# Patient Record
Sex: Female | Born: 1950 | Race: White | Hispanic: No | Marital: Single | Smoking: Never smoker
Health system: Southern US, Community
[De-identification: ages and names within clinical notes are randomized; demographics above are authoritative.]

## PROBLEM LIST (undated history)

## (undated) DIAGNOSIS — F419 Anxiety disorder, unspecified: Secondary | ICD-10-CM

## (undated) DIAGNOSIS — J45909 Unspecified asthma, uncomplicated: Secondary | ICD-10-CM

## (undated) DIAGNOSIS — F32A Depression, unspecified: Secondary | ICD-10-CM

## (undated) DIAGNOSIS — M1990 Unspecified osteoarthritis, unspecified site: Secondary | ICD-10-CM

## (undated) DIAGNOSIS — E785 Hyperlipidemia, unspecified: Secondary | ICD-10-CM

## (undated) DIAGNOSIS — I10 Essential (primary) hypertension: Secondary | ICD-10-CM

## (undated) DIAGNOSIS — I499 Cardiac arrhythmia, unspecified: Secondary | ICD-10-CM

## (undated) HISTORY — PX: CATARACT EXTRACTION: SUR2

## (undated) HISTORY — DX: Unspecified asthma, uncomplicated: J45.909

## (undated) HISTORY — DX: Hyperlipidemia, unspecified: E78.5

## (undated) HISTORY — DX: Essential (primary) hypertension: I10

---
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
## 1974-10-17 HISTORY — PX: BREAST LUMPECTOMY: SHX2

## 2002-04-11 ENCOUNTER — Encounter: Payer: Self-pay | Admitting: Family Medicine

## 2002-04-11 ENCOUNTER — Encounter: Admission: RE | Admit: 2002-04-11 | Discharge: 2002-04-11 | Payer: Self-pay | Admitting: Family Medicine

## 2003-09-03 ENCOUNTER — Other Ambulatory Visit: Admission: RE | Admit: 2003-09-03 | Discharge: 2003-09-03 | Payer: Self-pay | Admitting: *Deleted

## 2003-10-18 HISTORY — PX: CHOLECYSTECTOMY: SHX55

## 2004-02-24 ENCOUNTER — Ambulatory Visit (HOSPITAL_COMMUNITY): Admission: RE | Admit: 2004-02-24 | Discharge: 2004-02-24 | Payer: Self-pay | Admitting: Gastroenterology

## 2004-02-27 ENCOUNTER — Ambulatory Visit (HOSPITAL_COMMUNITY): Admission: RE | Admit: 2004-02-27 | Discharge: 2004-02-27 | Payer: Self-pay | Admitting: Gastroenterology

## 2004-04-29 ENCOUNTER — Encounter (INDEPENDENT_AMBULATORY_CARE_PROVIDER_SITE_OTHER): Payer: Self-pay | Admitting: Specialist

## 2004-04-29 ENCOUNTER — Ambulatory Visit (HOSPITAL_COMMUNITY): Admission: RE | Admit: 2004-04-29 | Discharge: 2004-04-29 | Payer: Self-pay | Admitting: Surgery

## 2006-12-11 ENCOUNTER — Encounter: Admission: RE | Admit: 2006-12-11 | Discharge: 2006-12-11 | Payer: Self-pay | Admitting: Family Medicine

## 2008-03-26 ENCOUNTER — Encounter: Admission: RE | Admit: 2008-03-26 | Discharge: 2008-03-26 | Payer: Self-pay | Admitting: Family Medicine

## 2009-04-12 ENCOUNTER — Emergency Department (HOSPITAL_COMMUNITY): Admission: EM | Admit: 2009-04-12 | Discharge: 2009-04-12 | Payer: Self-pay | Admitting: Family Medicine

## 2010-03-02 ENCOUNTER — Encounter: Admission: RE | Admit: 2010-03-02 | Discharge: 2010-03-02 | Payer: Self-pay | Admitting: Family Medicine

## 2011-03-04 NOTE — Op Note (Signed)
NAMELATERA, MCLIN                           ACCOUNT NO.:  [id number]   MEDICAL RECORD NO.:  [id number]                   PATIENT TYPE:  AMB   LOCATION:  ENDO                                 FACILITY:  [facility]   PHYSICIAN:  James L. Malon Kindle., M.D.          DATE OF BIRTH:  [birth year]   DATE OF PROCEDURE:  02/24/2004  DATE OF DISCHARGE:                                 OPERATIVE REPORT   PROCEDURE:  Esophagogastroduodenoscopy.   MEDICATIONS:  Fentanyl 100 mcg, Versed 10 mg IV, Cetacaine spray.   INDICATIONS:  A 60 year old with left upper quadrant pain.  Workup has shown  multiple gallstones.  She has also had heartburn and indigestion and been on  PPIs and Tums, Rolaids in the past.  This is done to evaluate for some other  cause for left upper quadrant pain rather than gallstones.   DESCRIPTION OF PROCEDURE:  The procedure had been explained to the patient  and consent obtained.  With the patient in the left lateral decubitus  position, the Olympus scope was inserted and advanced.  The patient had a  bit of trouble swallowing the scope but eventually was able to and got this  down.  The stomach was entered, pylorus identified and passed.  The duodenum  including the bulb and second portion was seen well and was normal.  The  scope was withdrawn back into the stomach.  The pyloric channel, antrum, and  body were normal.  Fundus and cardia were seen well on the retroflexed view  and were normal.  The distal and proximal esophagus were endoscopically  normal.  The patient had a large hiatal hernia with a widely patent GE  junction but no ulcerations and no Barrett's esophagitis.  The proximal  esophagus was seen well upon removal and was normal.  The scope was  withdrawn.  The patient tolerated the procedure well.   ASSESSMENT:  1. Esophageal reflux with large hiatal hernia on endoscopy, 530.81.  2. No obvious cause for her left upper quadrant pain, 789.02.   PLAN:  Will make  arrangements for a CT of the abdomen to evaluate further  for some other cause of her left upper quadrant pain.  Will have her follow  up with Dr. Jamey Ripa.                                               James L. Malon Kindle., M.D.    Waldron Session  D:  02/24/2004  T:  02/25/2004  Job:  [id number]   cc:   Currie Paris, M.D.  [address]  [place]  [place] [address]  Fax: [phone]   Otilio Connors. Gerri Spore, M.D.  [address]  [place]  [place] [address]  Fax: [phone]

## 2011-03-04 NOTE — Op Note (Signed)
NAMEZIZA, HASTINGS NO.:  [id number]   MEDICAL RECORD NO.:  [id number]                   PATIENT TYPE:  OIB   LOCATION:  2899                                 FACILITY:  [facility]   PHYSICIAN:  Currie Paris, M.D.           DATE OF BIRTH:  05-12-1951   DATE OF PROCEDURE:  04/29/2004  DATE OF DISCHARGE:                                 OPERATIVE REPORT   [facility] #[id number]   PREOPERATIVE DIAGNOSIS:  Gallstone.   POSTOPERATIVE DIAGNOSES:  1. Gallstone.  2. Umbilical hernia.   OPERATION PERFORMED:  Laparoscopic cholecystectomy, umbilical hernia repair.   SURGEON:  Currie Paris, M.D.   ASSISTANT:  Rose Phi. Maple Hudson, M.D.   ANESTHESIA:  General endotracheal.   INDICATIONS FOR PROCEDURE:  The patient is a 60 year old with symptoms of  biliary tract disease and multiple stones seen on ultrasound.  She was  scheduled for laparoscopic cholecystectomy.   DESCRIPTION OF PROCEDURE:  The patient was seen in the holding area and had  no further questions.  She was taken to the operating room and after  satisfactory general endotracheal anesthesia had been obtained, the abdomen  was prepped and draped.  0.25% plain Marcaine was injected around the  umbilicus and the skin incision was made.  She had a small umbilical hernia  with a chronic sac which we entered and then had to clean this off of the  subcutaneous tissue of the umbilical skin.  A little bit of the omentum that  was coming out was amputated with cautery and I could enter the peritoneal  cavity here.  Once I had the fascia identified and cleaned up so that we  could get a pursestring suture in, I put a pursestring of 0 Vicryl in taking  large bites of fascia.   The Hasson was introduced.  The abdomen insufflated to 15.  Using the camera  we could see that she had a fairly large uterine fibroid as noted  preoperatively.  Photographic documentation was taken.  The patient was  placed in reversed  Trendelenburg position and a 10-11 placed in the  epigastrium and two 5 mm's laterally.  There were some omental adhesions and  the duodenum was stuck up a little bit to the gallbladder.  These were taken  down gently.  The peritoneum over the cystic duct was opened and the  triangle of Calot entered.  I could clearly see the anterior branch of the  cystic artery coming up and onto the gallbladder and the cystic duct  likewise coming up onto the gallbladder and both were dissected free.  I put  three clips on the artery and one on the duct at its junction with the  gallbladder.  The duct was opened but I was unable to thread a catheter in  for a cholangiogram because there appeared to be a little valve which we  could never overcome in the  cystic duct.  At this point I stopped attempting  the cholangiogram, went ahead and divided the cystic artery.  The cystic  duct was dissected a little bit further down so that I could get three good  clips across it which were placed and then the cystic duct was divided.  The  gallbladder was removed from below to above.  I essentially got a little bit  more mobility.  I could see the posterior branch of the cystic artery which  was clipped and divided.  The gallbladder was removed all the way from below  to above with coagulation current of the cautery.  Just prior to  disconnecting, we irrigated and made sure everything was dry.  The  gallbladder was removed from the bed, and then placed in a bag and brought  out the umbilical port.  The abdomen was reinsufflated and we made a final  check to make sure there were no bile or blood leaks and everything appeared  dry.  Lateral ports were removed and there was no bleeding.  The umbilical  site was closed with a pursestring suture and we got what appeared to be  airtight closure.  The abdomen was deflated through the epigastric port.  The skin was closed with 4-0 Monocryl subcuticular plus Dermabond.  The   patient tolerated the procedure well.  There were no operative  complications. All counts were correct.                                               Currie Paris, M.D.    CJS/MEDQ  D:  04/29/2004  T:  04/29/2004  Job:  [id number]   cc:   Otilio Connors. Gerri Spore, M.D.  [address]  [place]  [place] [address]  Fax: [phone]

## 2011-05-23 ENCOUNTER — Encounter: Payer: Self-pay | Admitting: Gastroenterology

## 2012-05-22 ENCOUNTER — Encounter: Payer: Self-pay | Admitting: Gastroenterology

## 2013-02-11 ENCOUNTER — Other Ambulatory Visit: Payer: Self-pay

## 2013-02-11 DIAGNOSIS — Z1231 Encounter for screening mammogram for malignant neoplasm of breast: Secondary | ICD-10-CM

## 2013-02-19 ENCOUNTER — Ambulatory Visit: Payer: Self-pay

## 2013-03-12 ENCOUNTER — Ambulatory Visit: Payer: Self-pay

## 2013-04-09 ENCOUNTER — Ambulatory Visit: Payer: Self-pay

## 2015-02-17 ENCOUNTER — Other Ambulatory Visit: Payer: Self-pay | Admitting: Family Medicine

## 2015-02-17 DIAGNOSIS — E049 Nontoxic goiter, unspecified: Secondary | ICD-10-CM

## 2015-02-20 ENCOUNTER — Ambulatory Visit
Admission: RE | Admit: 2015-02-20 | Discharge: 2015-02-20 | Disposition: A | Discharge status: Home / Self Care | Payer: BLUE CROSS/BLUE SHIELD | Source: Ambulatory Visit | Attending: Family Medicine | Admitting: Family Medicine

## 2015-02-20 DIAGNOSIS — E049 Nontoxic goiter, unspecified: Secondary | ICD-10-CM

## 2018-05-15 DIAGNOSIS — E785 Hyperlipidemia, unspecified: Secondary | ICD-10-CM | POA: Diagnosis not present

## 2018-05-15 DIAGNOSIS — Z6838 Body mass index (BMI) 38.0-38.9, adult: Secondary | ICD-10-CM | POA: Diagnosis not present

## 2018-05-15 DIAGNOSIS — J45909 Unspecified asthma, uncomplicated: Secondary | ICD-10-CM | POA: Diagnosis not present

## 2018-05-15 DIAGNOSIS — R69 Illness, unspecified: Secondary | ICD-10-CM | POA: Diagnosis not present

## 2018-05-15 DIAGNOSIS — I10 Essential (primary) hypertension: Secondary | ICD-10-CM | POA: Diagnosis not present

## 2018-05-15 DIAGNOSIS — R739 Hyperglycemia, unspecified: Secondary | ICD-10-CM | POA: Diagnosis not present

## 2018-05-22 DIAGNOSIS — M1712 Unilateral primary osteoarthritis, left knee: Secondary | ICD-10-CM | POA: Diagnosis not present

## 2018-05-22 DIAGNOSIS — M25562 Pain in left knee: Secondary | ICD-10-CM | POA: Diagnosis not present

## 2018-05-22 DIAGNOSIS — M545 Low back pain: Secondary | ICD-10-CM | POA: Diagnosis not present

## 2018-05-22 DIAGNOSIS — M5442 Lumbago with sciatica, left side: Secondary | ICD-10-CM | POA: Diagnosis not present

## 2018-06-19 DIAGNOSIS — M1712 Unilateral primary osteoarthritis, left knee: Secondary | ICD-10-CM | POA: Diagnosis not present

## 2018-11-07 DIAGNOSIS — H40023 Open angle with borderline findings, high risk, bilateral: Secondary | ICD-10-CM | POA: Insufficient documentation

## 2018-11-07 DIAGNOSIS — H25811 Combined forms of age-related cataract, right eye: Secondary | ICD-10-CM | POA: Diagnosis not present

## 2018-11-07 DIAGNOSIS — H25812 Combined forms of age-related cataract, left eye: Secondary | ICD-10-CM | POA: Insufficient documentation

## 2018-11-07 DIAGNOSIS — D487 Neoplasm of uncertain behavior of other specified sites: Secondary | ICD-10-CM | POA: Diagnosis not present

## 2018-11-07 DIAGNOSIS — H47323 Drusen of optic disc, bilateral: Secondary | ICD-10-CM | POA: Diagnosis not present

## 2018-12-11 DIAGNOSIS — R69 Illness, unspecified: Secondary | ICD-10-CM | POA: Diagnosis not present

## 2019-02-14 DIAGNOSIS — M545 Low back pain: Secondary | ICD-10-CM | POA: Diagnosis not present

## 2019-02-14 DIAGNOSIS — M1712 Unilateral primary osteoarthritis, left knee: Secondary | ICD-10-CM | POA: Diagnosis not present

## 2019-02-19 DIAGNOSIS — M5416 Radiculopathy, lumbar region: Secondary | ICD-10-CM | POA: Diagnosis not present

## 2019-02-19 DIAGNOSIS — M1712 Unilateral primary osteoarthritis, left knee: Secondary | ICD-10-CM | POA: Diagnosis not present

## 2019-02-19 DIAGNOSIS — S335XXD Sprain of ligaments of lumbar spine, subsequent encounter: Secondary | ICD-10-CM | POA: Diagnosis not present

## 2019-04-08 DIAGNOSIS — B301 Conjunctivitis due to adenovirus: Secondary | ICD-10-CM | POA: Diagnosis not present

## 2019-05-07 DIAGNOSIS — M1712 Unilateral primary osteoarthritis, left knee: Secondary | ICD-10-CM | POA: Diagnosis not present

## 2019-05-14 DIAGNOSIS — R69 Illness, unspecified: Secondary | ICD-10-CM | POA: Diagnosis not present

## 2019-05-28 DIAGNOSIS — R69 Illness, unspecified: Secondary | ICD-10-CM | POA: Diagnosis not present

## 2019-06-03 DIAGNOSIS — R69 Illness, unspecified: Secondary | ICD-10-CM | POA: Diagnosis not present

## 2019-06-18 DIAGNOSIS — M1712 Unilateral primary osteoarthritis, left knee: Secondary | ICD-10-CM | POA: Diagnosis not present

## 2019-06-25 DIAGNOSIS — M1712 Unilateral primary osteoarthritis, left knee: Secondary | ICD-10-CM | POA: Diagnosis not present

## 2019-07-02 DIAGNOSIS — M1712 Unilateral primary osteoarthritis, left knee: Secondary | ICD-10-CM | POA: Diagnosis not present

## 2019-07-26 DIAGNOSIS — H1045 Other chronic allergic conjunctivitis: Secondary | ICD-10-CM | POA: Diagnosis not present

## 2019-07-30 DIAGNOSIS — M1712 Unilateral primary osteoarthritis, left knee: Secondary | ICD-10-CM | POA: Diagnosis not present

## 2019-07-30 DIAGNOSIS — M25562 Pain in left knee: Secondary | ICD-10-CM | POA: Diagnosis not present

## 2019-08-13 DIAGNOSIS — H1045 Other chronic allergic conjunctivitis: Secondary | ICD-10-CM | POA: Diagnosis not present

## 2019-10-03 DIAGNOSIS — M1712 Unilateral primary osteoarthritis, left knee: Secondary | ICD-10-CM | POA: Diagnosis not present

## 2019-10-07 DIAGNOSIS — Z1159 Encounter for screening for other viral diseases: Secondary | ICD-10-CM | POA: Diagnosis not present

## 2019-10-18 HISTORY — PX: CATARACT EXTRACTION W/ INTRAOCULAR LENS IMPLANT: SHX1309

## 2019-10-23 DIAGNOSIS — Z5181 Encounter for therapeutic drug level monitoring: Secondary | ICD-10-CM | POA: Diagnosis not present

## 2019-10-23 DIAGNOSIS — R739 Hyperglycemia, unspecified: Secondary | ICD-10-CM | POA: Diagnosis not present

## 2019-11-05 DIAGNOSIS — M1712 Unilateral primary osteoarthritis, left knee: Secondary | ICD-10-CM | POA: Diagnosis not present

## 2019-11-15 ENCOUNTER — Ambulatory Visit: Payer: Medicare HMO | Admitting: Cardiology

## 2019-11-15 ENCOUNTER — Encounter: Payer: Self-pay | Admitting: Cardiology

## 2019-11-15 ENCOUNTER — Other Ambulatory Visit: Payer: Self-pay

## 2019-11-15 VITALS — BP 153/93 | HR 107 | Temp 97.3°F | Ht 64.0 in | Wt 201.0 lb

## 2019-11-15 DIAGNOSIS — I10 Essential (primary) hypertension: Secondary | ICD-10-CM

## 2019-11-15 DIAGNOSIS — R000 Tachycardia, unspecified: Secondary | ICD-10-CM

## 2019-11-15 DIAGNOSIS — Z01810 Encounter for preprocedural cardiovascular examination: Secondary | ICD-10-CM

## 2019-11-15 MED ORDER — PROPRANOLOL HCL ER 80 MG PO CP24: 80.0000 mg | ORAL_CAPSULE | Freq: Every day | ORAL | 1 refills | Status: DC

## 2019-11-15 NOTE — Progress Notes (Signed)
Primary Physician/Referring:  Maurice Small, MD  Patient ID: Emily Cruz, female    DOB: December 29, 1950, 69 y.o.   MRN: [id number]  Chief Complaint  Patient presents with  . Tachycardia  . New Patient (Initial Visit)   HPI:    Emily Cruz  is a 69 y.o. with hypertension, history of hyperlipidemia, hyperglycemia, referred to [place] in urgent basis for evaluation of tachycardia.  She recently presented for left knee surgery, and EKG found elevated heart rate 130 bpm.  Surgery was therefore canceled and she would recommend that she follow-up with cardiology.  She now presents for further evaluation.  She has noticed elevated heart rate from 80-110 bpm for the last several months. She has been inactive due to her left knee. Notices elevated heart rate with minimal activity. Denies any palpitations. No chest pain or dyspnea on exertion.   She reports being on Verapamil since 1990's after having elevated heart rate after drinking coffee. She tried Toprolol, but did not tolerate due to asthma.  She has lost 30 lbs over the last year and half, and since her hyperlipidemia and hyperglycemia have improved. No family history of heart disease.   Past Medical History:  Diagnosis Date  . Asthma   . Hypertension    Past Surgical History:  Procedure Laterality Date  . BREAST LUMPECTOMY  1976  . CHOLECYSTECTOMY  2005  . TONSILLECTOMY AND ADENOIDECTOMY  1958   Social History   Tobacco Use  . Smoking status: Never Smoker  . Smokeless tobacco: Never Used  Substance Use Topics  . Alcohol use: Never    ROS  Review of Systems  Constitution: Negative for decreased appetite, malaise/fatigue, weight gain and weight loss.  Eyes: Negative for visual disturbance.  Cardiovascular: Negative for chest pain, claudication, dyspnea on exertion, leg swelling, orthopnea, palpitations and syncope.  Respiratory: Negative for hemoptysis and wheezing.   Endocrine: Negative for cold intolerance and heat intolerance.   Hematologic/Lymphatic: Does not bruise/bleed easily.  Skin: Negative for nail changes.  Musculoskeletal: Negative for muscle weakness and myalgias.  Gastrointestinal: Negative for abdominal pain, change in bowel habit, nausea and vomiting.  Neurological: Negative for difficulty with concentration, dizziness, focal weakness and headaches.  Psychiatric/Behavioral: Negative for altered mental status and suicidal ideas.  All other systems reviewed and are negative.  Objective  Blood pressure (!) 153/93, pulse (!) 107, temperature (!) 97.3 F (36.3 C), height 5' 4"  (1.626 m), weight 201 lb (91.2 kg), SpO2 98 %.  Vitals with BMI 11/15/2019  Height 5' 4"   Weight 201 lbs  BMI 35.45  Systolic 625  Diastolic 93  Pulse 638    Physical Exam  Constitutional: She is oriented to person, place, and time. Vital signs are normal. She appears well-developed and well-nourished.  Tremor noted  HENT:  Head: Normocephalic and atraumatic.  Cardiovascular: Regular rhythm, normal heart sounds and intact distal pulses. Tachycardia present.  Pulmonary/Chest: Effort normal and breath sounds normal. No accessory muscle usage. No respiratory distress.  Abdominal: Soft. Bowel sounds are normal.  Musculoskeletal:        General: Normal range of motion.     Cervical back: Normal range of motion.  Neurological: She is alert and oriented to person, place, and time.  Skin: Skin is warm and dry.  Vitals reviewed.  Laboratory examination:   No results for input(s): NA, K, CL, CO2, GLUCOSE, BUN, CREATININE, CALCIUM, GFRNONAA, GFRAA in the last 8760 hours. CrCl cannot be calculated (No successful lab value found.).  No flowsheet  data found. No flowsheet data found. Lipid Panel  No results found for: CHOL, TRIG, HDL, CHOLHDL, VLDL, LDLCALC, LDLDIRECT HEMOGLOBIN A1C No results found for: HGBA1C, MPG TSH Recent Labs    11/15/19 1401  TSH 2.960    External labs 10/23/2019: Hemoglobin A1c 5.2%.  CBC normal.   Creatinine 0.6, EGFR 108/141, potassium 3.9, CMP normal.    Medications and allergies  No Known Allergies   Current Outpatient Medications  Medication Instructions  . albuterol (VENTOLIN HFA) 108 (90 Base) MCG/ACT inhaler 2 puffs, Inhalation, Every 6 hours PRN  . ALPRAZolam (XANAX) 0.125 mg, Oral, 2 times daily PRN  . buPROPion (WELLBUTRIN) 100 mg, Oral, 2 times daily  . pantoprazole (PROTONIX) 40 MG tablet 1 tablet, Oral, Daily  . propranolol ER (INDERAL LA) 80 mg, Oral, Daily  . verapamil (CALAN-SR) 240 mg, Oral,  Every morning - 10a    Radiology:  No results found.  Cardiac Studies:   none  Assessment     ICD-10-CM   1. Sinus tachycardia  R00.0 EKG 12-Lead    TSH  2. Preoperative cardiovascular examination  Z01.810   3. Primary hypertension  I10     EKG 11/15/2019: Sinus tachycardia at 102 bpm, normal axis, no evidence of ischemia.   Meds ordered this encounter  Medications  . propranolol ER (INDERAL LA) 80 MG 24 hr capsule    Sig: Take 1 capsule (80 mg total) by mouth daily.    Dispense:  30 capsule    Refill:  1    Order Specific Question:   Supervising Provider    Answer:   Adrian Prows [[id number]]    There are no discontinued medications.   Recommendations:   Patient urgently referred to [place] by Dr. Justin Mend for evaluation of tachycardia.  Over the last few months patient has noticed elevated heart rate with even minimal activities or just sitting resting.  EKG from surgical center and today were reviewed, revealing sinus tachycardia.  She has not had any symptoms of angina; however, her functional capacity is limited due to left knee pain.  She has had 30 pound weight loss over the last 1 year and has tremor also noted on exam today.  Question if hyperthyroidism may be contributing to her elevated heart rate.  I will obtain TSH level today.  Depending upon these results, will decide if she needs further cardiac work-up.  Her knee surgery has been postponed now to March 3, so  we will have time to perform cardiac testing if needed.  She is requesting a letter to go back to work, do not see any contraindication from cardiac standpoint.  No clinical evidence of heart failure.  Her blood pressure is elevated.  I will start her on propanolol 80 mg daily given her potential underlying thyroid issues and elevated heart rate.  Continue with verapamil.  She does have a history of asthma but has not had any exacerbations over the last several years and will need to monitor this.  I have encouraged her to continue to work on her weight loss.  I will see her back in 2 weeks for follow-up.   *I have discussed this case with Dr. Einar Gip and he personally examined the patient and participated in formulating the plan.*  *Addendum: TSH is within normal limits. Will proceed with echocardiogram and nuclear stress testing for follow up.    Miquel Dunn, MSN, APRN, FNP-C [facility]. [place] Office: [phone] Fax: [phone]

## 2019-11-16 LAB — TSH: TSH: 2.96 u[IU]/mL (ref 0.450–4.500)

## 2019-11-19 NOTE — Progress Notes (Signed)
Done will schedule

## 2019-11-19 NOTE — Progress Notes (Signed)
Orders placed. Please contact patient to schedule stress and echo

## 2019-11-19 NOTE — Addendum Note (Signed)
Addended by: Gwinda Maine on: 11/19/2019 01:54 PM   Modules accepted: Orders

## 2019-11-19 NOTE — Telephone Encounter (Signed)
Per pt please read thank you

## 2019-11-19 NOTE — Progress Notes (Signed)
Called pt to inform her about her TSH result. Pt agrees to the cardic testing

## 2019-11-20 ENCOUNTER — Other Ambulatory Visit: Payer: Self-pay

## 2019-11-20 ENCOUNTER — Ambulatory Visit (INDEPENDENT_AMBULATORY_CARE_PROVIDER_SITE_OTHER): Payer: Medicare HMO

## 2019-11-20 DIAGNOSIS — R000 Tachycardia, unspecified: Secondary | ICD-10-CM

## 2019-11-20 DIAGNOSIS — Z01810 Encounter for preprocedural cardiovascular examination: Secondary | ICD-10-CM

## 2019-11-22 NOTE — Telephone Encounter (Signed)
From patient.

## 2019-11-25 ENCOUNTER — Other Ambulatory Visit: Payer: Self-pay

## 2019-11-25 ENCOUNTER — Ambulatory Visit (INDEPENDENT_AMBULATORY_CARE_PROVIDER_SITE_OTHER): Payer: Medicare HMO

## 2019-11-25 DIAGNOSIS — Z01810 Encounter for preprocedural cardiovascular examination: Secondary | ICD-10-CM | POA: Diagnosis not present

## 2019-11-25 DIAGNOSIS — R000 Tachycardia, unspecified: Secondary | ICD-10-CM | POA: Diagnosis not present

## 2019-11-26 DIAGNOSIS — H52223 Regular astigmatism, bilateral: Secondary | ICD-10-CM | POA: Diagnosis not present

## 2019-11-26 DIAGNOSIS — H524 Presbyopia: Secondary | ICD-10-CM | POA: Diagnosis not present

## 2019-11-26 DIAGNOSIS — H5213 Myopia, bilateral: Secondary | ICD-10-CM | POA: Diagnosis not present

## 2019-11-26 DIAGNOSIS — H401130 Primary open-angle glaucoma, bilateral, stage unspecified: Secondary | ICD-10-CM | POA: Diagnosis not present

## 2019-11-26 DIAGNOSIS — H43811 Vitreous degeneration, right eye: Secondary | ICD-10-CM | POA: Diagnosis not present

## 2019-11-26 DIAGNOSIS — H2513 Age-related nuclear cataract, bilateral: Secondary | ICD-10-CM | POA: Diagnosis not present

## 2019-11-27 ENCOUNTER — Encounter: Payer: Self-pay | Admitting: Cardiology

## 2019-11-29 ENCOUNTER — Ambulatory Visit: Payer: Medicare HMO | Admitting: Cardiology

## 2019-11-29 ENCOUNTER — Other Ambulatory Visit: Payer: Self-pay

## 2019-11-29 ENCOUNTER — Encounter: Payer: Self-pay | Admitting: Cardiology

## 2019-11-29 VITALS — BP 149/76 | HR 76 | Temp 96.8°F | Ht 64.0 in | Wt 200.0 lb

## 2019-11-29 DIAGNOSIS — I10 Essential (primary) hypertension: Secondary | ICD-10-CM | POA: Diagnosis not present

## 2019-11-29 DIAGNOSIS — Z01810 Encounter for preprocedural cardiovascular examination: Secondary | ICD-10-CM

## 2019-11-29 DIAGNOSIS — R000 Tachycardia, unspecified: Secondary | ICD-10-CM | POA: Diagnosis not present

## 2019-11-29 MED ORDER — OLMESARTAN MEDOXOMIL 20 MG PO TABS: 20.0000 mg | ORAL_TABLET | Freq: Every day | ORAL | 2 refills | Status: DC

## 2019-11-29 MED ORDER — PROPRANOLOL HCL ER 80 MG PO CP24: 80.0000 mg | ORAL_CAPSULE | Freq: Every day | ORAL | 1 refills | Status: DC

## 2019-11-29 NOTE — Progress Notes (Signed)
Primary Physician/Referring:  Maurice Small, MD  Patient ID: Emily Cruz, female    DOB: 08/07/1951, 69 y.o.   MRN: [id number]  Chief Complaint  Patient presents with  . Hypertension  . Tachycardia  . Follow-up   HPI:    Emily Cruz  is a 69 y.o. with hypertension, history of hyperlipidemia, hyperglycemia, referred to [place] in urgent basis for evaluation of tachycardia.  She recently presented for left knee surgery, and EKG found elevated heart rate 130 bpm.  Surgery was therefore canceled. TSH was within normal limits. She underwent lexiscan nuclear stress test and echocardiogram and now presents for follow up.  Since being on Propanolol, she reports feeling the best she has in a long time. States that her heart rate is significantly better and she feels good.   She has lost 30 lbs over the last year and half, and since her hyperlipidemia and hyperglycemia have improved. No family history of heart disease.   Past Medical History:  Diagnosis Date  . Asthma   . Hypertension    Past Surgical History:  Procedure Laterality Date  . BREAST LUMPECTOMY  1976  . CHOLECYSTECTOMY  2005  . TONSILLECTOMY AND ADENOIDECTOMY  1958   Social History   Tobacco Use  . Smoking status: Never Smoker  . Smokeless tobacco: Never Used  Substance Use Topics  . Alcohol use: Never    ROS  Review of Systems  Constitution: Negative for decreased appetite, malaise/fatigue, weight gain and weight loss.  Eyes: Negative for visual disturbance.  Cardiovascular: Negative for chest pain, claudication, dyspnea on exertion, leg swelling, orthopnea, palpitations and syncope.  Respiratory: Negative for hemoptysis and wheezing.   Endocrine: Negative for cold intolerance and heat intolerance.  Hematologic/Lymphatic: Does not bruise/bleed easily.  Skin: Negative for nail changes.  Musculoskeletal: Negative for muscle weakness and myalgias.  Gastrointestinal: Negative for abdominal pain, change in bowel habit,  nausea and vomiting.  Neurological: Negative for difficulty with concentration, dizziness, focal weakness and headaches.  Psychiatric/Behavioral: Negative for altered mental status and suicidal ideas.  All other systems reviewed and are negative.  Objective  Blood pressure (!) 149/76, pulse 76, temperature (!) 96.8 F (36 C), height _0  (1.626 m), weight 200 lb (90.7 kg), SpO2 96 %.  Vitals with BMI 11/29/2019 11/15/2019  Height _1  _2   Weight 200 lbs 201 lbs  BMI 41.28 78.67  Systolic 672 094  Diastolic 76 93  Pulse 76 709    Physical Exam  Constitutional: She is oriented to person, place, and time. Vital signs are normal. She appears well-developed and well-nourished.  Tremor noted  HENT:  Head: Normocephalic and atraumatic.  Cardiovascular: Normal rate, regular rhythm, normal heart sounds and intact distal pulses.  Pulmonary/Chest: Effort normal and breath sounds normal. No accessory muscle usage. No respiratory distress.  Abdominal: Soft. Bowel sounds are normal.  Musculoskeletal:        General: Normal range of motion.     Cervical back: Normal range of motion.  Neurological: She is alert and oriented to person, place, and time.  Skin: Skin is warm and dry.  Vitals reviewed.  Laboratory examination:   No results for input(s): NA, K, CL, CO2, GLUCOSE, BUN, CREATININE, CALCIUM, GFRNONAA, GFRAA in the last 8760 hours. CrCl cannot be calculated (No successful lab value found.).  No flowsheet data found. No flowsheet data found. Lipid Panel  No results found for: CHOL, TRIG, HDL, CHOLHDL, VLDL, LDLCALC, LDLDIRECT HEMOGLOBIN A1C No results found for: HGBA1C, MPG  TSH Recent Labs    11/15/19 1401  TSH 2.960    External labs 10/23/2019: Hemoglobin A1c 5.2%.  CBC normal.  Creatinine 0.6, EGFR 108/141, potassium 3.9, CMP normal.    Medications and allergies  No Known Allergies   Current Outpatient Medications  Medication Instructions  . albuterol (VENTOLIN HFA)  108 (90 Base) MCG/ACT inhaler 2 puffs, Inhalation, Every 6 hours PRN  . ALPRAZolam (XANAX) 0.125 mg, Oral, 2 times daily PRN  . buPROPion (WELLBUTRIN) 100 mg, Oral, 2 times daily  . olmesartan (BENICAR) 20 mg, Oral, Daily  . pantoprazole (PROTONIX) 40 MG tablet 1 tablet, Oral, Daily  . propranolol ER (INDERAL LA) 80 mg, Oral, Daily  . verapamil (CALAN-SR) 240 mg, Oral,  Every morning - 10a    Radiology:  No results found.  Cardiac Studies:   Lexiscan Tetrofosmin Stress Test  11/25/2019: Nondiagnostic ECG stress. Mild soft tissue attenuation noted infero-septal region, otherwise normal myocardial perfusion. All segments of left ventricle demonstrated normal wall motion and thickening. Stress LV EF is hyperdynamic 80%.  No previous exam available for comparison. Low risk study.   Echocardiogram 11/20/2019:  Left ventricle cavity is normal in size. Moderate concentric hypertrophy  of the left ventricle. Normal global wall motion. Normal LV systolic  function with EF 55%. Indeterminate diastolic filling pattern.  Left atrial cavity is mildly dilated.  Trileaflet aortic valve. Moderate (Grade II) aortic regurgitation.  Mild (Grade I) mitral regurgitation.  Mild tricuspid regurgitation. Estimated pulmonary artery systolic pressure  is 24 mmHg.  Assessment     ICD-10-CM   1. Primary hypertension  Y22 Basic metabolic panel  2. Sinus tachycardia  R00.0   3. Preoperative cardiovascular examination  Z01.810     EKG 11/15/2019: Sinus tachycardia at 102 bpm, normal axis, no evidence of ischemia.   Meds ordered this encounter  Medications  . olmesartan (BENICAR) 20 MG tablet    Sig: Take 1 tablet (20 mg total) by mouth daily.    Dispense:  30 tablet    Refill:  2    Order Specific Question:   Supervising Provider    Answer:   Adrian Prows [[id number]]  . propranolol ER (INDERAL LA) 80 MG 24 hr capsule    Sig: Take 1 capsule (80 mg total) by mouth daily.    Dispense:  90 capsule    Refill:   1    Order Specific Question:   Supervising Provider    Answer:   Adrian Prows [[id number]]     I have reviewed and discussed her recent nuclear stress test results, low risk study. Echocardiogram was also discussed that shows normal LVEF, normal wall motion, moderate LVH, and moderate AI. Given her test result findings and that her tachycardia has resolved, she can proceed with surgery low risk for perioperative CV complications. Clearance letter has been sent to Dr. Berenice Primas.   She will need repeat echocardiogram in 1 year for surveillance of moderate AI. Continue with Verapamil as well as her Propanolol. She does need better blood pressure control. Will add Olmesartan 20 mg daily. Will check BMP in 10 days for surveillance.Continue with low sodium diet and start exercise once able from knee standpoint. If BP is well controlled on her next office visit, will consider PRN follow up. I will see her back in 2-3 weeks prior to surgery to ensure that her blood pressure is well controlled.     Miquel Dunn, MSN, APRN, FNP-C [facility]. [place] Office: [phone] Fax: [phone]

## 2019-12-02 NOTE — Telephone Encounter (Signed)
Please read

## 2019-12-12 DIAGNOSIS — I10 Essential (primary) hypertension: Secondary | ICD-10-CM | POA: Diagnosis not present

## 2019-12-13 LAB — BASIC METABOLIC PANEL
BUN/Creatinine Ratio: 32 — ABNORMAL HIGH (ref 12–28)
BUN: 22 mg/dL (ref 8–27)
CO2: 25 mmol/L (ref 20–29)
Calcium: 9.8 mg/dL (ref 8.7–10.3)
Chloride: 105 mmol/L (ref 96–106)
Creatinine, Ser: 0.68 mg/dL (ref 0.57–1.00)
GFR calc Af Amer: 105 mL/min/{1.73_m2} (ref >59)
GFR calc non Af Amer: 91 mL/min/{1.73_m2} (ref >59)
Glucose: 89 mg/dL (ref 65–99)
Potassium: 4.8 mmol/L (ref 3.5–5.2)
Sodium: 145 mmol/L — ABNORMAL HIGH (ref 134–144)

## 2019-12-16 HISTORY — PX: KNEE ARTHROPLASTY: SHX992

## 2019-12-18 DIAGNOSIS — Z96652 Presence of left artificial knee joint: Secondary | ICD-10-CM | POA: Diagnosis not present

## 2019-12-18 DIAGNOSIS — M1712 Unilateral primary osteoarthritis, left knee: Secondary | ICD-10-CM | POA: Diagnosis not present

## 2019-12-18 DIAGNOSIS — G8918 Other acute postprocedural pain: Secondary | ICD-10-CM | POA: Diagnosis not present

## 2019-12-19 DIAGNOSIS — Z96652 Presence of left artificial knee joint: Secondary | ICD-10-CM | POA: Diagnosis not present

## 2019-12-19 DIAGNOSIS — Z471 Aftercare following joint replacement surgery: Secondary | ICD-10-CM | POA: Diagnosis not present

## 2019-12-19 DIAGNOSIS — I10 Essential (primary) hypertension: Secondary | ICD-10-CM | POA: Diagnosis not present

## 2019-12-19 DIAGNOSIS — Z7982 Long term (current) use of aspirin: Secondary | ICD-10-CM | POA: Diagnosis not present

## 2019-12-19 DIAGNOSIS — R69 Illness, unspecified: Secondary | ICD-10-CM | POA: Diagnosis not present

## 2019-12-19 DIAGNOSIS — Z9181 History of falling: Secondary | ICD-10-CM | POA: Diagnosis not present

## 2019-12-23 DIAGNOSIS — I10 Essential (primary) hypertension: Secondary | ICD-10-CM | POA: Diagnosis not present

## 2019-12-23 DIAGNOSIS — Z9181 History of falling: Secondary | ICD-10-CM | POA: Diagnosis not present

## 2019-12-23 DIAGNOSIS — Z471 Aftercare following joint replacement surgery: Secondary | ICD-10-CM | POA: Diagnosis not present

## 2019-12-23 DIAGNOSIS — R69 Illness, unspecified: Secondary | ICD-10-CM | POA: Diagnosis not present

## 2019-12-23 DIAGNOSIS — Z96652 Presence of left artificial knee joint: Secondary | ICD-10-CM | POA: Diagnosis not present

## 2019-12-23 DIAGNOSIS — Z7982 Long term (current) use of aspirin: Secondary | ICD-10-CM | POA: Diagnosis not present

## 2019-12-25 DIAGNOSIS — Z96652 Presence of left artificial knee joint: Secondary | ICD-10-CM | POA: Diagnosis not present

## 2019-12-25 DIAGNOSIS — I10 Essential (primary) hypertension: Secondary | ICD-10-CM | POA: Diagnosis not present

## 2019-12-25 DIAGNOSIS — Z9181 History of falling: Secondary | ICD-10-CM | POA: Diagnosis not present

## 2019-12-25 DIAGNOSIS — R69 Illness, unspecified: Secondary | ICD-10-CM | POA: Diagnosis not present

## 2019-12-25 DIAGNOSIS — Z471 Aftercare following joint replacement surgery: Secondary | ICD-10-CM | POA: Diagnosis not present

## 2019-12-25 DIAGNOSIS — Z7982 Long term (current) use of aspirin: Secondary | ICD-10-CM | POA: Diagnosis not present

## 2019-12-27 DIAGNOSIS — I10 Essential (primary) hypertension: Secondary | ICD-10-CM | POA: Diagnosis not present

## 2019-12-27 DIAGNOSIS — R69 Illness, unspecified: Secondary | ICD-10-CM | POA: Diagnosis not present

## 2019-12-27 DIAGNOSIS — Z9181 History of falling: Secondary | ICD-10-CM | POA: Diagnosis not present

## 2019-12-27 DIAGNOSIS — Z471 Aftercare following joint replacement surgery: Secondary | ICD-10-CM | POA: Diagnosis not present

## 2019-12-27 DIAGNOSIS — Z96652 Presence of left artificial knee joint: Secondary | ICD-10-CM | POA: Diagnosis not present

## 2019-12-27 DIAGNOSIS — Z7982 Long term (current) use of aspirin: Secondary | ICD-10-CM | POA: Diagnosis not present

## 2019-12-30 DIAGNOSIS — I10 Essential (primary) hypertension: Secondary | ICD-10-CM | POA: Diagnosis not present

## 2019-12-30 DIAGNOSIS — Z96652 Presence of left artificial knee joint: Secondary | ICD-10-CM | POA: Diagnosis not present

## 2019-12-30 DIAGNOSIS — Z471 Aftercare following joint replacement surgery: Secondary | ICD-10-CM | POA: Diagnosis not present

## 2019-12-30 DIAGNOSIS — Z9181 History of falling: Secondary | ICD-10-CM | POA: Diagnosis not present

## 2019-12-30 DIAGNOSIS — Z7982 Long term (current) use of aspirin: Secondary | ICD-10-CM | POA: Diagnosis not present

## 2019-12-30 DIAGNOSIS — R69 Illness, unspecified: Secondary | ICD-10-CM | POA: Diagnosis not present

## 2020-01-02 DIAGNOSIS — R269 Unspecified abnormalities of gait and mobility: Secondary | ICD-10-CM | POA: Diagnosis not present

## 2020-01-02 DIAGNOSIS — R262 Difficulty in walking, not elsewhere classified: Secondary | ICD-10-CM | POA: Diagnosis not present

## 2020-01-02 DIAGNOSIS — M1712 Unilateral primary osteoarthritis, left knee: Secondary | ICD-10-CM | POA: Diagnosis not present

## 2020-01-02 DIAGNOSIS — Z9889 Other specified postprocedural states: Secondary | ICD-10-CM | POA: Diagnosis not present

## 2020-01-02 DIAGNOSIS — Z96652 Presence of left artificial knee joint: Secondary | ICD-10-CM | POA: Diagnosis not present

## 2020-01-02 DIAGNOSIS — M25462 Effusion, left knee: Secondary | ICD-10-CM | POA: Diagnosis not present

## 2020-01-08 DIAGNOSIS — R262 Difficulty in walking, not elsewhere classified: Secondary | ICD-10-CM | POA: Diagnosis not present

## 2020-01-08 DIAGNOSIS — Z96652 Presence of left artificial knee joint: Secondary | ICD-10-CM | POA: Diagnosis not present

## 2020-01-08 DIAGNOSIS — M25462 Effusion, left knee: Secondary | ICD-10-CM | POA: Diagnosis not present

## 2020-01-08 DIAGNOSIS — R269 Unspecified abnormalities of gait and mobility: Secondary | ICD-10-CM | POA: Diagnosis not present

## 2020-01-10 DIAGNOSIS — R262 Difficulty in walking, not elsewhere classified: Secondary | ICD-10-CM | POA: Diagnosis not present

## 2020-01-10 DIAGNOSIS — M25462 Effusion, left knee: Secondary | ICD-10-CM | POA: Diagnosis not present

## 2020-01-10 DIAGNOSIS — R269 Unspecified abnormalities of gait and mobility: Secondary | ICD-10-CM | POA: Diagnosis not present

## 2020-01-10 DIAGNOSIS — Z96652 Presence of left artificial knee joint: Secondary | ICD-10-CM | POA: Diagnosis not present

## 2020-01-16 DIAGNOSIS — M25462 Effusion, left knee: Secondary | ICD-10-CM | POA: Diagnosis not present

## 2020-01-16 DIAGNOSIS — R269 Unspecified abnormalities of gait and mobility: Secondary | ICD-10-CM | POA: Diagnosis not present

## 2020-01-16 DIAGNOSIS — Z96652 Presence of left artificial knee joint: Secondary | ICD-10-CM | POA: Diagnosis not present

## 2020-01-16 DIAGNOSIS — R262 Difficulty in walking, not elsewhere classified: Secondary | ICD-10-CM | POA: Diagnosis not present

## 2020-01-21 DIAGNOSIS — Z96652 Presence of left artificial knee joint: Secondary | ICD-10-CM | POA: Diagnosis not present

## 2020-01-21 DIAGNOSIS — R262 Difficulty in walking, not elsewhere classified: Secondary | ICD-10-CM | POA: Diagnosis not present

## 2020-01-21 DIAGNOSIS — M25462 Effusion, left knee: Secondary | ICD-10-CM | POA: Diagnosis not present

## 2020-01-21 DIAGNOSIS — R269 Unspecified abnormalities of gait and mobility: Secondary | ICD-10-CM | POA: Diagnosis not present

## 2020-01-22 ENCOUNTER — Other Ambulatory Visit: Payer: Self-pay | Admitting: Family Medicine

## 2020-01-22 DIAGNOSIS — Z1231 Encounter for screening mammogram for malignant neoplasm of breast: Secondary | ICD-10-CM

## 2020-01-23 DIAGNOSIS — R262 Difficulty in walking, not elsewhere classified: Secondary | ICD-10-CM | POA: Diagnosis not present

## 2020-01-23 DIAGNOSIS — Z96652 Presence of left artificial knee joint: Secondary | ICD-10-CM | POA: Diagnosis not present

## 2020-01-23 DIAGNOSIS — M25462 Effusion, left knee: Secondary | ICD-10-CM | POA: Diagnosis not present

## 2020-01-23 DIAGNOSIS — R269 Unspecified abnormalities of gait and mobility: Secondary | ICD-10-CM | POA: Diagnosis not present

## 2020-01-27 ENCOUNTER — Ambulatory Visit: Payer: Medicare HMO | Admitting: Cardiology

## 2020-01-28 DIAGNOSIS — M25462 Effusion, left knee: Secondary | ICD-10-CM | POA: Diagnosis not present

## 2020-01-28 DIAGNOSIS — R269 Unspecified abnormalities of gait and mobility: Secondary | ICD-10-CM | POA: Diagnosis not present

## 2020-01-28 DIAGNOSIS — R262 Difficulty in walking, not elsewhere classified: Secondary | ICD-10-CM | POA: Diagnosis not present

## 2020-01-28 DIAGNOSIS — Z96652 Presence of left artificial knee joint: Secondary | ICD-10-CM | POA: Diagnosis not present

## 2020-01-30 ENCOUNTER — Ambulatory Visit: Payer: BLUE CROSS/BLUE SHIELD

## 2020-01-30 DIAGNOSIS — Z96652 Presence of left artificial knee joint: Secondary | ICD-10-CM | POA: Diagnosis not present

## 2020-01-30 DIAGNOSIS — R269 Unspecified abnormalities of gait and mobility: Secondary | ICD-10-CM | POA: Diagnosis not present

## 2020-01-30 DIAGNOSIS — M25462 Effusion, left knee: Secondary | ICD-10-CM | POA: Diagnosis not present

## 2020-01-30 DIAGNOSIS — R262 Difficulty in walking, not elsewhere classified: Secondary | ICD-10-CM | POA: Diagnosis not present

## 2020-02-11 DIAGNOSIS — M25462 Effusion, left knee: Secondary | ICD-10-CM | POA: Diagnosis not present

## 2020-02-11 DIAGNOSIS — Z96652 Presence of left artificial knee joint: Secondary | ICD-10-CM | POA: Diagnosis not present

## 2020-02-11 DIAGNOSIS — R269 Unspecified abnormalities of gait and mobility: Secondary | ICD-10-CM | POA: Diagnosis not present

## 2020-02-11 DIAGNOSIS — R262 Difficulty in walking, not elsewhere classified: Secondary | ICD-10-CM | POA: Diagnosis not present

## 2020-02-13 DIAGNOSIS — R269 Unspecified abnormalities of gait and mobility: Secondary | ICD-10-CM | POA: Diagnosis not present

## 2020-02-13 DIAGNOSIS — Z96652 Presence of left artificial knee joint: Secondary | ICD-10-CM | POA: Diagnosis not present

## 2020-02-13 DIAGNOSIS — R262 Difficulty in walking, not elsewhere classified: Secondary | ICD-10-CM | POA: Diagnosis not present

## 2020-02-13 DIAGNOSIS — M25462 Effusion, left knee: Secondary | ICD-10-CM | POA: Diagnosis not present

## 2020-02-18 DIAGNOSIS — M25462 Effusion, left knee: Secondary | ICD-10-CM | POA: Diagnosis not present

## 2020-02-18 DIAGNOSIS — Z96652 Presence of left artificial knee joint: Secondary | ICD-10-CM | POA: Diagnosis not present

## 2020-02-18 DIAGNOSIS — R262 Difficulty in walking, not elsewhere classified: Secondary | ICD-10-CM | POA: Diagnosis not present

## 2020-02-18 DIAGNOSIS — R269 Unspecified abnormalities of gait and mobility: Secondary | ICD-10-CM | POA: Diagnosis not present

## 2020-02-20 ENCOUNTER — Other Ambulatory Visit: Payer: Self-pay | Admitting: Cardiology

## 2020-02-27 DIAGNOSIS — Z96652 Presence of left artificial knee joint: Secondary | ICD-10-CM | POA: Diagnosis not present

## 2020-02-27 DIAGNOSIS — M25462 Effusion, left knee: Secondary | ICD-10-CM | POA: Diagnosis not present

## 2020-02-27 DIAGNOSIS — R269 Unspecified abnormalities of gait and mobility: Secondary | ICD-10-CM | POA: Diagnosis not present

## 2020-02-27 DIAGNOSIS — R262 Difficulty in walking, not elsewhere classified: Secondary | ICD-10-CM | POA: Diagnosis not present

## 2020-03-04 ENCOUNTER — Other Ambulatory Visit: Payer: Self-pay

## 2020-03-04 ENCOUNTER — Ambulatory Visit
Admission: RE | Admit: 2020-03-04 | Discharge: 2020-03-04 | Disposition: A | Discharge status: Home / Self Care | Payer: Medicare HMO | Source: Ambulatory Visit | Attending: Family Medicine | Admitting: Family Medicine

## 2020-03-04 DIAGNOSIS — Z1231 Encounter for screening mammogram for malignant neoplasm of breast: Secondary | ICD-10-CM

## 2020-03-12 DIAGNOSIS — M25462 Effusion, left knee: Secondary | ICD-10-CM | POA: Diagnosis not present

## 2020-03-12 DIAGNOSIS — Z96652 Presence of left artificial knee joint: Secondary | ICD-10-CM | POA: Diagnosis not present

## 2020-03-31 DIAGNOSIS — H25813 Combined forms of age-related cataract, bilateral: Secondary | ICD-10-CM | POA: Diagnosis not present

## 2020-03-31 DIAGNOSIS — H31101 Choroidal degeneration, unspecified, right eye: Secondary | ICD-10-CM | POA: Diagnosis not present

## 2020-03-31 DIAGNOSIS — H401130 Primary open-angle glaucoma, bilateral, stage unspecified: Secondary | ICD-10-CM | POA: Diagnosis not present

## 2020-04-17 DIAGNOSIS — H2513 Age-related nuclear cataract, bilateral: Secondary | ICD-10-CM | POA: Diagnosis not present

## 2020-04-17 DIAGNOSIS — H25013 Cortical age-related cataract, bilateral: Secondary | ICD-10-CM | POA: Diagnosis not present

## 2020-04-17 DIAGNOSIS — I10 Essential (primary) hypertension: Secondary | ICD-10-CM | POA: Diagnosis not present

## 2020-04-17 DIAGNOSIS — H25043 Posterior subcapsular polar age-related cataract, bilateral: Secondary | ICD-10-CM | POA: Diagnosis not present

## 2020-04-17 DIAGNOSIS — H2512 Age-related nuclear cataract, left eye: Secondary | ICD-10-CM | POA: Diagnosis not present

## 2020-05-01 DIAGNOSIS — H2513 Age-related nuclear cataract, bilateral: Secondary | ICD-10-CM | POA: Diagnosis not present

## 2020-05-01 DIAGNOSIS — I10 Essential (primary) hypertension: Secondary | ICD-10-CM | POA: Diagnosis not present

## 2020-05-14 DIAGNOSIS — H2512 Age-related nuclear cataract, left eye: Secondary | ICD-10-CM | POA: Diagnosis not present

## 2020-05-15 DIAGNOSIS — H2511 Age-related nuclear cataract, right eye: Secondary | ICD-10-CM | POA: Diagnosis not present

## 2020-05-16 ENCOUNTER — Other Ambulatory Visit: Payer: Self-pay | Admitting: Cardiology

## 2020-06-04 DIAGNOSIS — H2511 Age-related nuclear cataract, right eye: Secondary | ICD-10-CM | POA: Diagnosis not present

## 2020-10-27 ENCOUNTER — Other Ambulatory Visit: Payer: Self-pay | Admitting: Cardiology

## 2020-11-20 DIAGNOSIS — Z0000 Encounter for general adult medical examination without abnormal findings: Secondary | ICD-10-CM | POA: Diagnosis not present

## 2020-11-20 DIAGNOSIS — E785 Hyperlipidemia, unspecified: Secondary | ICD-10-CM | POA: Diagnosis not present

## 2020-11-20 DIAGNOSIS — E049 Nontoxic goiter, unspecified: Secondary | ICD-10-CM | POA: Diagnosis not present

## 2020-11-20 DIAGNOSIS — I10 Essential (primary) hypertension: Secondary | ICD-10-CM | POA: Diagnosis not present

## 2020-11-20 DIAGNOSIS — R69 Illness, unspecified: Secondary | ICD-10-CM | POA: Diagnosis not present

## 2020-11-20 DIAGNOSIS — R739 Hyperglycemia, unspecified: Secondary | ICD-10-CM | POA: Diagnosis not present

## 2020-11-20 DIAGNOSIS — J45909 Unspecified asthma, uncomplicated: Secondary | ICD-10-CM | POA: Diagnosis not present

## 2020-11-24 DIAGNOSIS — M5442 Lumbago with sciatica, left side: Secondary | ICD-10-CM | POA: Diagnosis not present

## 2020-11-24 DIAGNOSIS — M545 Low back pain, unspecified: Secondary | ICD-10-CM | POA: Diagnosis not present

## 2020-12-15 DIAGNOSIS — M5442 Lumbago with sciatica, left side: Secondary | ICD-10-CM | POA: Diagnosis not present

## 2020-12-15 DIAGNOSIS — M545 Low back pain, unspecified: Secondary | ICD-10-CM | POA: Diagnosis not present

## 2020-12-17 DIAGNOSIS — H401130 Primary open-angle glaucoma, bilateral, stage unspecified: Secondary | ICD-10-CM | POA: Diagnosis not present

## 2020-12-17 DIAGNOSIS — Z961 Presence of intraocular lens: Secondary | ICD-10-CM | POA: Diagnosis not present

## 2020-12-17 DIAGNOSIS — H43813 Vitreous degeneration, bilateral: Secondary | ICD-10-CM | POA: Diagnosis not present

## 2020-12-17 DIAGNOSIS — H47323 Drusen of optic disc, bilateral: Secondary | ICD-10-CM | POA: Diagnosis not present

## 2021-04-13 ENCOUNTER — Other Ambulatory Visit: Payer: Self-pay | Admitting: Cardiology

## 2021-04-16 ENCOUNTER — Other Ambulatory Visit: Payer: Self-pay | Admitting: Family Medicine

## 2021-04-16 DIAGNOSIS — Z1231 Encounter for screening mammogram for malignant neoplasm of breast: Secondary | ICD-10-CM

## 2021-04-20 ENCOUNTER — Other Ambulatory Visit: Payer: Self-pay

## 2021-04-20 ENCOUNTER — Ambulatory Visit
Admission: RE | Admit: 2021-04-20 | Discharge: 2021-04-20 | Disposition: A | Discharge status: Home / Self Care | Payer: Medicare HMO | Source: Ambulatory Visit | Attending: Family Medicine | Admitting: Family Medicine

## 2021-04-20 DIAGNOSIS — Z1231 Encounter for screening mammogram for malignant neoplasm of breast: Secondary | ICD-10-CM

## 2021-05-06 ENCOUNTER — Ambulatory Visit: Payer: Medicare HMO | Admitting: Cardiology

## 2021-05-17 ENCOUNTER — Encounter: Payer: Self-pay | Admitting: Cardiology

## 2021-05-17 ENCOUNTER — Other Ambulatory Visit: Payer: Self-pay

## 2021-05-17 ENCOUNTER — Ambulatory Visit: Payer: Medicare HMO | Admitting: Cardiology

## 2021-05-17 VITALS — BP 128/79 | HR 80 | Resp 16 | Ht 64.0 in | Wt 206.6 lb

## 2021-05-17 DIAGNOSIS — I351 Nonrheumatic aortic (valve) insufficiency: Secondary | ICD-10-CM

## 2021-05-17 DIAGNOSIS — R000 Tachycardia, unspecified: Secondary | ICD-10-CM | POA: Diagnosis not present

## 2021-05-17 DIAGNOSIS — I10 Essential (primary) hypertension: Secondary | ICD-10-CM | POA: Diagnosis not present

## 2021-05-17 DIAGNOSIS — R002 Palpitations: Secondary | ICD-10-CM

## 2021-05-17 MED ORDER — PROPRANOLOL HCL ER 80 MG PO CP24: 80.0000 mg | ORAL_CAPSULE | ORAL | 1 refills | Status: DC

## 2021-05-17 MED ORDER — VERAPAMIL HCL ER 240 MG PO TBCR: 240.0000 mg | EXTENDED_RELEASE_TABLET | Freq: Two times a day (BID) | ORAL | 0 refills | Status: DC

## 2021-05-17 NOTE — Patient Instructions (Addendum)
Week 1: Propranolol 80 mg p.o. every other day and verapamil to 240 mg p.o. daily  Week 2: Propranolol 80 mg p.o. every 2 days and verapamil 240 mg p.o. twice daily  Week 3: Stop propranolol and verapamil 240 mg p.o. twice daily.

## 2021-05-17 NOTE — Progress Notes (Signed)
Date:  05/17/2021   ID:  Emily Cruz, DOB 08/11/1951, MRN [id number]  PCP:  Maurice Small, MD  Cardiologist:  Rex Kras, DO, [facility] (established care 05/17/2021) Former Cardiology Providers: Jeri Lager, APRN, FNP-C   Chief Complaint  Patient presents with   Medication Refill   Tachycardia    HPI  Emily Cruz is a 70 y.o. female who presents to the office with a chief complaint of " medication refills and tachycardia management." Patient's past medical history and cardiovascular risk factors include: Hyperlipidemia, hypertension, hyperglycemia, advanced age, postmenopausal female, obesity due to excess calories.  Initially referred to the office for preoperative risk stratification/tachycardia and has been following Jeri Lager last office visit being in February 2021.  Since last office visit in February 2021 patient states that she is doing well from a cardiovascular standpoint.  Who presents today for a refill on propranolol.  Patient states that since starting propranolol her tachycardia has improved significantly and she feels the best she is ever been.   No hospitalizations or urgent care visits from a cardiovascular standpoint.  Review of her electronic medical record also notes that she is on verapamil 240 mg p.o. daily and propranolol 80 mg p.o. daily.  FUNCTIONAL STATUS: No structured exercise program or daily routine.    ALLERGIES: No Known Allergies  MEDICATION LIST PRIOR TO VISIT: Current Meds  Medication Sig   albuterol (VENTOLIN HFA) 108 (90 Base) MCG/ACT inhaler Inhale 2 puffs into the lungs every 6 (six) hours as needed for wheezing or shortness of breath.   ALPRAZolam (XANAX) 0.25 MG tablet Take 0.125 mg by mouth 2 (two) times daily as needed.   aspirin EC 81 MG tablet Take 81 mg by mouth daily. Swallow whole.   buPROPion (WELLBUTRIN) 100 MG tablet Take 100 mg by mouth 2 (two) times daily.   gabapentin (NEURONTIN) 300 MG capsule Take 300 mg by  mouth 2 (two) times daily.   pantoprazole (PROTONIX) 40 MG tablet Take 1 tablet by mouth daily.   [DISCONTINUED] propranolol ER (INDERAL LA) 80 MG 24 hr capsule TAKE 1 CAPSULE BY MOUTH EVERY DAY   [DISCONTINUED] verapamil (CALAN-SR) 240 MG CR tablet Take 240 mg by mouth every morning.     PAST MEDICAL HISTORY: Past Medical History:  Diagnosis Date   Asthma    Hyperlipidemia    Hypertension     PAST SURGICAL HISTORY: Past Surgical History:  Procedure Laterality Date   BREAST LUMPECTOMY Left 1976   CATARACT EXTRACTION     CHOLECYSTECTOMY  2005   TONSILLECTOMY AND ADENOIDECTOMY  1958    FAMILY HISTORY: The patient family history includes Diabetes in her mother; Emphysema in her paternal grandmother; Heart disease in her mother; Hypertension in her paternal grandmother; Stroke in her paternal grandmother.  SOCIAL HISTORY:  The patient  reports that she has never smoked. She has never used smokeless tobacco. She reports that she does not drink alcohol and does not use drugs.  REVIEW OF SYSTEMS: Review of Systems  Constitutional: Negative for chills and fever.  HENT:  Negative for hoarse voice and nosebleeds.   Eyes:  Negative for discharge, double vision and pain.  Cardiovascular:  Negative for chest pain, claudication, dyspnea on exertion, leg swelling, near-syncope, orthopnea, palpitations, paroxysmal nocturnal dyspnea and syncope.  Respiratory:  Negative for hemoptysis and shortness of breath.   Musculoskeletal:  Negative for muscle cramps and myalgias.  Gastrointestinal:  Negative for abdominal pain, constipation, diarrhea, hematemesis, hematochezia, melena, nausea and vomiting.  Neurological:  Negative for dizziness and light-headedness.   PHYSICAL EXAM: Vitals with BMI 05/17/2021 11/29/2019 11/15/2019  Height '5\' 4"'$  '5\' 4"'$  '5\' 4"'$   Weight 206 lbs 10 oz 200 lbs 201 lbs  BMI 35.45 [id number]  Systolic 0000000 [id number] 0000000  Diastolic 79 76 93  Pulse 80 76 107   CONSTITUTIONAL:  Well-developed and well-nourished. No acute distress.  SKIN: Skin is warm and dry. No rash noted. No cyanosis. No pallor. No jaundice HEAD: Normocephalic and atraumatic.  EYES: No scleral icterus MOUTH/THROAT: Moist oral membranes.  NECK: No JVD present. No thyromegaly noted. No carotid bruits  LYMPHATIC: No visible cervical adenopathy.  CHEST Normal respiratory effort. No intercostal retractions  LUNGS: Clear to auscultation bilaterally.  No stridor. No wheezes. No rales.  CARDIOVASCULAR: Regular rate and rhythm, positive S1-S2, soft 3 out of 6 diastolic murmur heard at the left sternal border, no gallops or rubs. ABDOMINAL: Obese, soft, nontender, nondistended, positive bowel sounds in all 4 quadrants.  No apparent ascites.  EXTREMITIES: No peripheral edema  HEMATOLOGIC: No significant bruising NEUROLOGIC: Oriented to person, place, and time. Nonfocal. Normal muscle tone.  PSYCHIATRIC: Normal mood and affect. Normal behavior. Cooperative  CARDIAC DATABASE: EKG: 05/17/2021: Normal sinus rhythm, 75 bpm, normal axis, without underlying ischemia or injury pattern.   Echocardiogram: 11/20/2019: Left ventricle cavity is normal in size. Moderate concentric hypertrophy of the left ventricle. Normal global wall motion. Normal LV systolic function with EF 55%. Indeterminate diastolic filling pattern. Left atrial cavity is mildly dilated. Trileaflet aortic valve.  Moderate (Grade II) aortic regurgitation. Mild (Grade I) mitral regurgitation. Mild tricuspid regurgitation. Estimated pulmonary artery systolic pressure is 24 mmHg.   Stress Testing: Lexiscan Tetrofosmin Stress Test  11/25/2019: Nondiagnostic ECG stress. Mild soft tissue attenuation noted infero-septal region, otherwise normal myocardial perfusion. All segments of left ventricle demonstrated normal wall motion and thickening. Stress LV EF is hyperdynamic 80%. No previous exam available for comparison. Low risk study.  Heart  Catheterization: None  LABORATORY DATA: No flowsheet data found.  CMP Latest Ref Rng & Units 12/12/2019  Glucose 65 - 99 mg/dL 89  BUN 8 - 27 mg/dL 22  Creatinine 0.57 - 1.00 mg/dL 0.68  Sodium 134 - 144 mmol/L 145(H)  Potassium 3.5 - 5.2 mmol/L 4.8  Chloride 96 - 106 mmol/L 105  CO2 20 - 29 mmol/L 25  Calcium 8.7 - 10.3 mg/dL 9.8    Lipid Panel  No results found for: CHOL, TRIG, HDL, CHOLHDL, VLDL, LDLCALC, LDLDIRECT, LABVLDL  No components found for: NTPROBNP No results for input(s): PROBNP in the last 8760 hours. No results for input(s): TSH in the last 8760 hours.  BMP No results for input(s): NA, K, CL, CO2, GLUCOSE, BUN, CREATININE, CALCIUM, GFRNONAA, GFRAA in the last 8760 hours.  HEMOGLOBIN A1C No results found for: HGBA1C, MPG  IMPRESSION:    ICD-10-CM   1. Palpitations  R00.2 propranolol ER (INDERAL LA) 80 MG 24 hr capsule    verapamil (CALAN-SR) 240 MG CR tablet    2. Sinus tachycardia  R00.0 propranolol ER (INDERAL LA) 80 MG 24 hr capsule    verapamil (CALAN-SR) 240 MG CR tablet    3. Primary hypertension  I10 EKG 12-Lead    4. Nonrheumatic aortic valve insufficiency  I35.1 PCV ECHOCARDIOGRAM COMPLETE       RECOMMENDATIONS: NYONNA STANFILL is a 70 y.o. female whose past medical history and cardiac risk factors include: Hyperlipidemia, hypertension, hyperglycemia, advanced age, postmenopausal female, obesity due to excess calories.  In the past patient was evaluated for tachycardia and placed on verapamil as well as propranolol.  Given her underlying history of depression and currently on antidepressive medications recommended considering weaning her off of beta-blockers and only to be on calcium channel blockers given her preserved LVEF and history of depression.  Will uptitrate verapamil 240 mg p.o. twice daily.  Week 1: Propranolol 80 mg p.o. every other day and verapamil to 240 mg p.o. daily  Week 2: Propranolol 80 mg p.o. every 2 days and verapamil  240 mg p.o. twice daily  Week 3: Stop propranolol and verapamil 240 mg p.o. twice daily.   Plan echocardiogram to reevaluate the severity of aortic regurgitation.  Otherwise patient is relatively stable from a cardiovascular standpoint.  She denies any chest pain or anginal discomfort.  She is overall euvolemic and not in congestive heart failure.  I will see her back in 4 weeks to see if she is tolerating the new regimen with verapamil to 40 mg p.o. twice daily.  If her symptoms are well controlled we will continue with only calcium channel blockers otherwise I will switch her back to her original combination of verapamil and propranolol.  Patient is agreeable with the plan of care.  FINAL MEDICATION LIST END OF ENCOUNTER: Meds ordered this encounter  Medications   propranolol ER (INDERAL LA) 80 MG 24 hr capsule    Sig: Take 1 capsule (80 mg total) by mouth every other day.    Dispense:  90 capsule    Refill:  1   verapamil (CALAN-SR) 240 MG CR tablet    Sig: Take 1 tablet (240 mg total) by mouth 2 (two) times daily.    Dispense:  60 tablet    Refill:  0    Medications Discontinued During This Encounter  Medication Reason   olmesartan (BENICAR) 20 MG tablet Error   verapamil (CALAN-SR) 240 MG CR tablet Reorder   propranolol ER (INDERAL LA) 80 MG 24 hr capsule      Current Outpatient Medications:    albuterol (VENTOLIN HFA) 108 (90 Base) MCG/ACT inhaler, Inhale 2 puffs into the lungs every 6 (six) hours as needed for wheezing or shortness of breath., Disp: , Rfl:    ALPRAZolam (XANAX) 0.25 MG tablet, Take 0.125 mg by mouth 2 (two) times daily as needed., Disp: , Rfl:    aspirin EC 81 MG tablet, Take 81 mg by mouth daily. Swallow whole., Disp: , Rfl:    buPROPion (WELLBUTRIN) 100 MG tablet, Take 100 mg by mouth 2 (two) times daily., Disp: , Rfl:    gabapentin (NEURONTIN) 300 MG capsule, Take 300 mg by mouth 2 (two) times daily., Disp: , Rfl:    pantoprazole (PROTONIX) 40 MG tablet,  Take 1 tablet by mouth daily., Disp: , Rfl:    propranolol ER (INDERAL LA) 80 MG 24 hr capsule, Take 1 capsule (80 mg total) by mouth every other day., Disp: 90 capsule, Rfl: 1   verapamil (CALAN-SR) 240 MG CR tablet, Take 1 tablet (240 mg total) by mouth 2 (two) times daily., Disp: 60 tablet, Rfl: 0  Orders Placed This Encounter  Procedures   EKG 12-Lead   PCV ECHOCARDIOGRAM COMPLETE    Patient Instructions  Week 1: Propranolol 80 mg p.o. every other day and verapamil to 240 mg p.o. daily  Week 2: Propranolol 80 mg p.o. every 2 days and verapamil 240 mg p.o. twice daily  Week 3: Stop propranolol and verapamil 240 mg p.o. twice daily.    --  Continue cardiac medications as reconciled in final medication list. --Return in about 4 weeks (around 06/14/2021) for Follow up tachycardia . Or sooner if needed. --Continue follow-up with your primary care physician regarding the management of your other chronic comorbid conditions.  Patient's questions and concerns were addressed to her satisfaction. She voices understanding of the instructions provided during this encounter.   This note was created using a voice recognition software as a result there may be grammatical errors inadvertently enclosed that do not reflect the nature of this encounter. Every attempt is made to correct such errors.  Rex Kras, [place], [facility]  Pager: [phone] Office: [phone]

## 2021-05-18 NOTE — Telephone Encounter (Signed)
From patient.

## 2021-05-19 NOTE — Telephone Encounter (Signed)
I don't see diltiazem listed as a med she was on?

## 2021-05-19 NOTE — Telephone Encounter (Signed)
From pt

## 2021-05-19 NOTE — Telephone Encounter (Signed)
Spoke to the patient.  She would like to continue both propranolol and diltiazem as she was taking originally.

## 2021-05-19 NOTE — Telephone Encounter (Signed)
I called her earlier today to d/w her but it went to voicemail. Ask her when I can call her back today.

## 2021-05-19 NOTE — Telephone Encounter (Signed)
Called her but she did not pick up. Please call her and let me know. I will speak to her over the phone.

## 2021-05-20 ENCOUNTER — Ambulatory Visit: Payer: Medicare HMO

## 2021-05-20 ENCOUNTER — Other Ambulatory Visit: Payer: Self-pay

## 2021-05-20 DIAGNOSIS — I351 Nonrheumatic aortic (valve) insufficiency: Secondary | ICD-10-CM | POA: Diagnosis not present

## 2021-05-24 ENCOUNTER — Other Ambulatory Visit: Payer: Self-pay | Admitting: Cardiology

## 2021-05-24 DIAGNOSIS — R000 Tachycardia, unspecified: Secondary | ICD-10-CM

## 2021-05-24 DIAGNOSIS — R002 Palpitations: Secondary | ICD-10-CM

## 2021-05-26 ENCOUNTER — Other Ambulatory Visit: Payer: Self-pay | Admitting: Cardiology

## 2021-05-26 DIAGNOSIS — R000 Tachycardia, unspecified: Secondary | ICD-10-CM

## 2021-05-26 DIAGNOSIS — R002 Palpitations: Secondary | ICD-10-CM

## 2021-05-26 MED ORDER — PROPRANOLOL HCL ER 80 MG PO CP24
80.0000 mg | ORAL_CAPSULE | Freq: Every day | ORAL | 0 refills | Status: DC
Start: 2021-05-26 — End: 2021-10-19

## 2021-05-26 MED ORDER — VERAPAMIL HCL ER 240 MG PO TBCR: 240.0000 mg | EXTENDED_RELEASE_TABLET | Freq: Every day | ORAL | 0 refills | Status: AC

## 2021-05-27 NOTE — Telephone Encounter (Signed)
Spoke to her last week. Will go back to her original medications Diltiazem and Propanolol

## 2021-06-14 ENCOUNTER — Ambulatory Visit: Payer: Medicare HMO | Admitting: Cardiology

## 2021-07-01 IMAGING — MG DIGITAL SCREENING BILAT W/ TOMO W/ CAD
8 series · 8 of 24 positions shown · non-contrast
Comparison: Previous exam(s).

CLINICAL DATA: Screening.

EXAM:
DIGITAL SCREENING BILATERAL MAMMOGRAM WITH TOMO AND CAD

[R CC synth-2D]
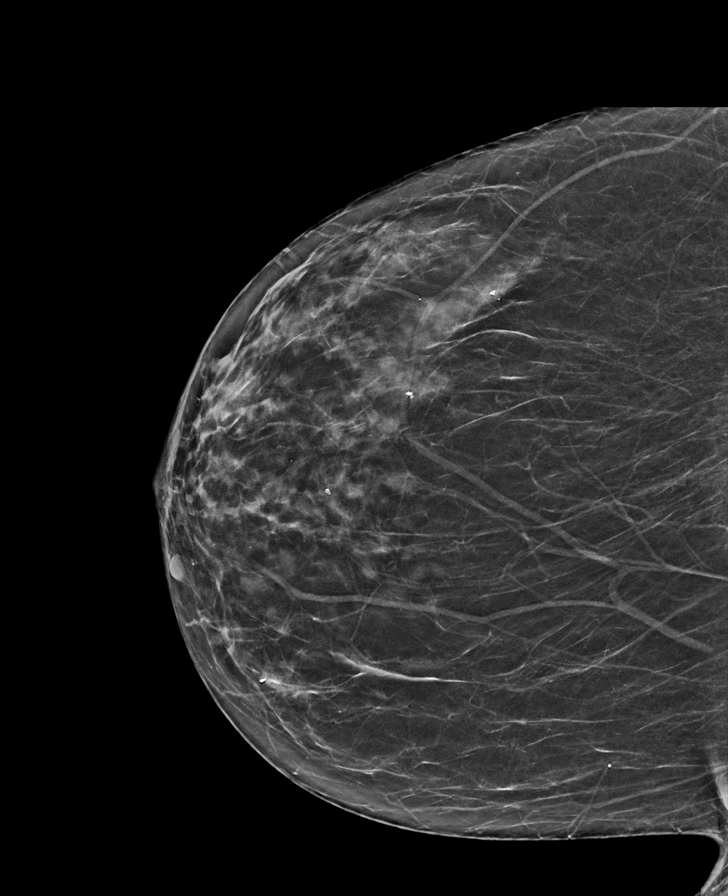

[L MLO synth-2D]
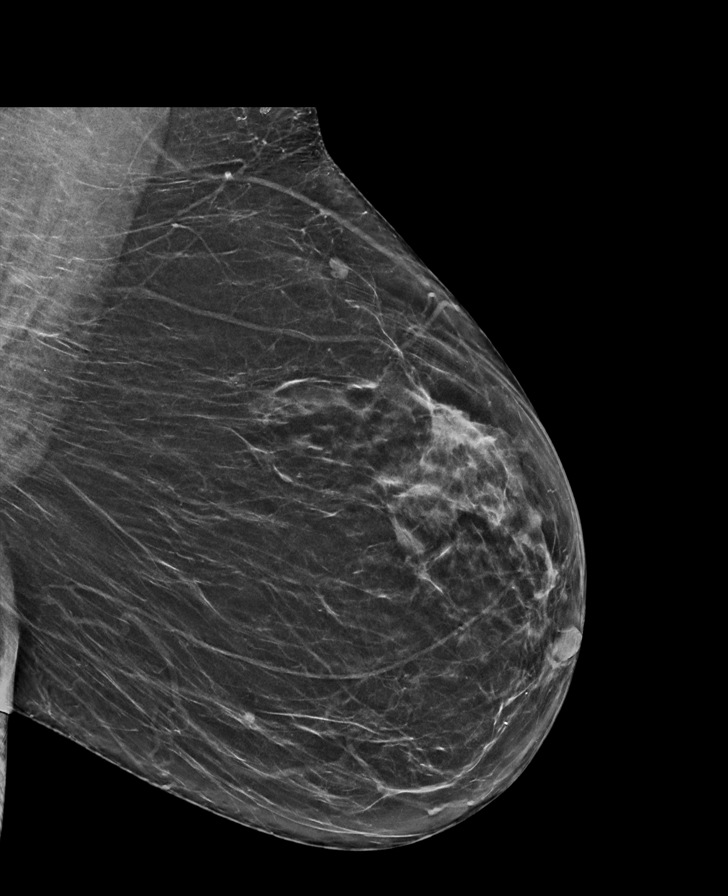

[L CC synth-2D]
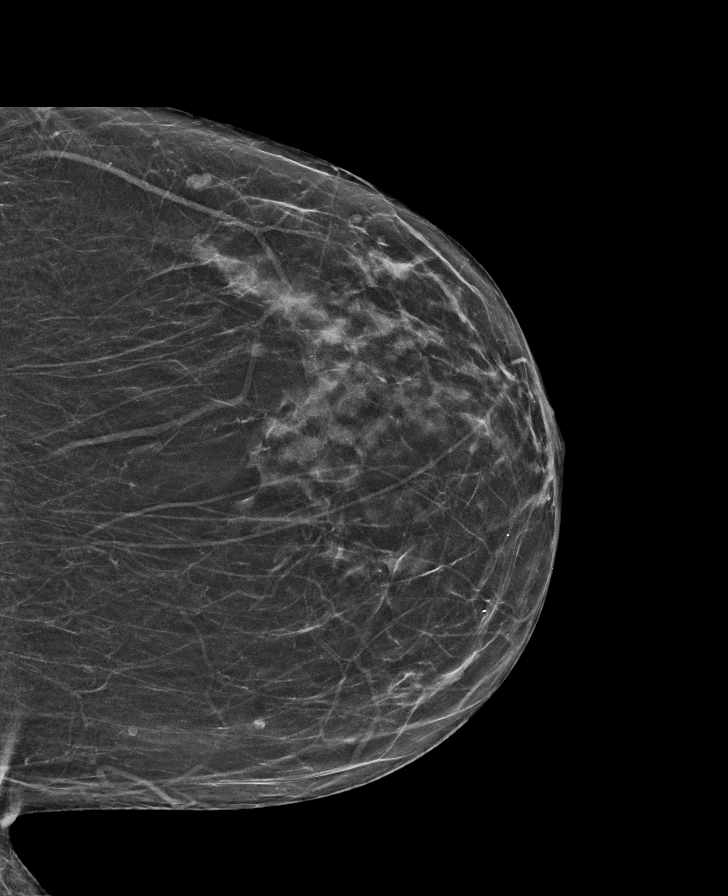

[R MLO synth-2D]
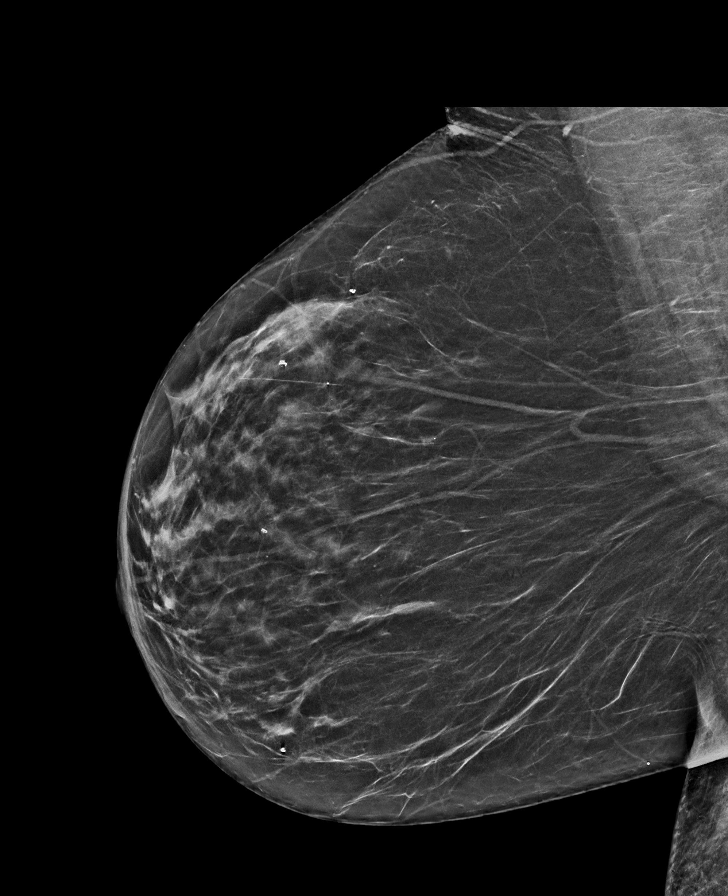

[L MLO tomo · tomo slice 33/65.0]
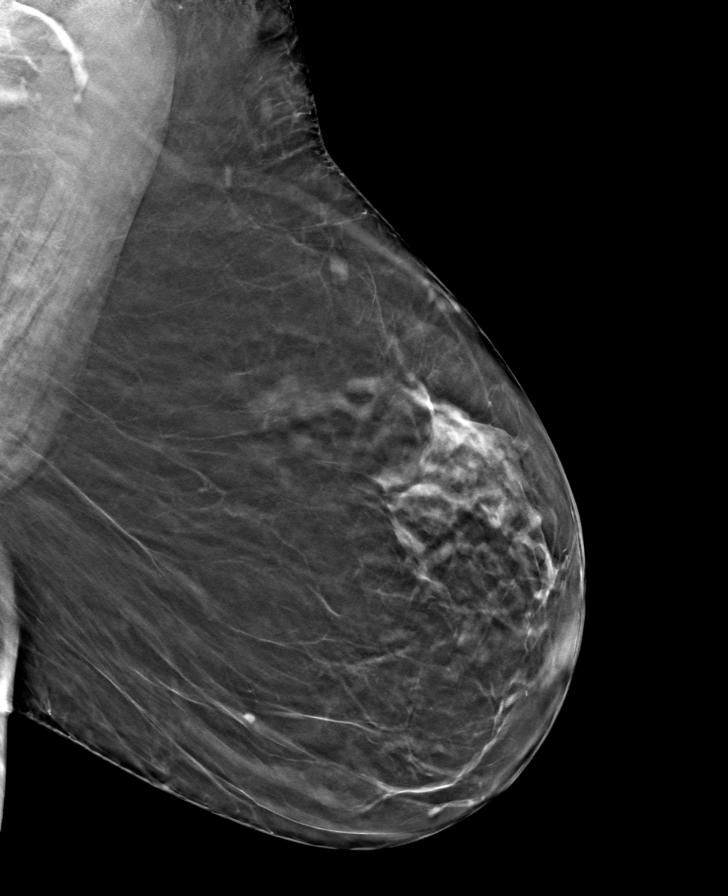

[R MLO tomo · tomo slice 32/63.0]
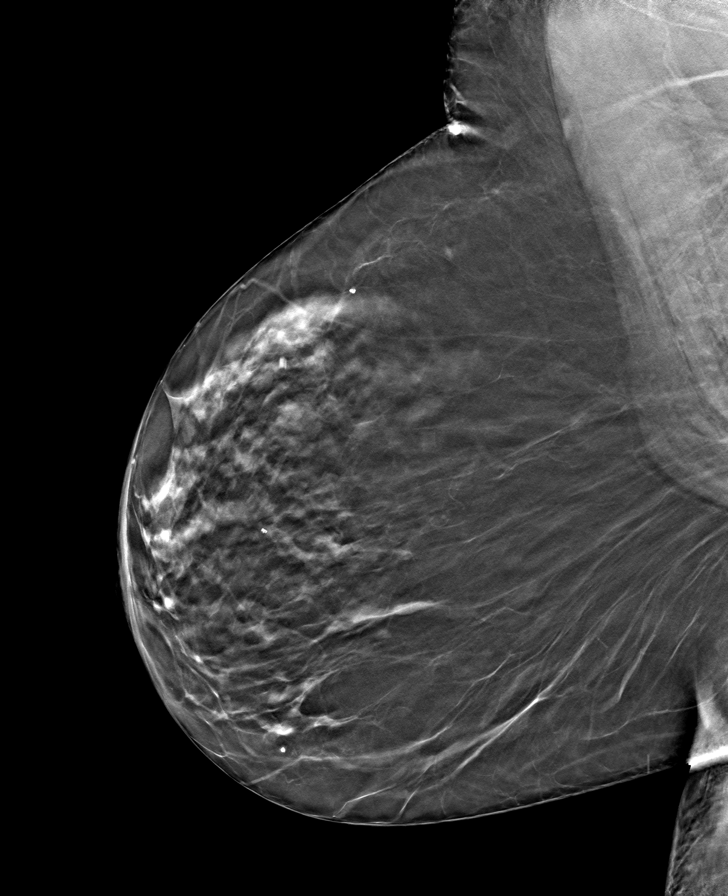

[L CC tomo · tomo slice 29/56.0]
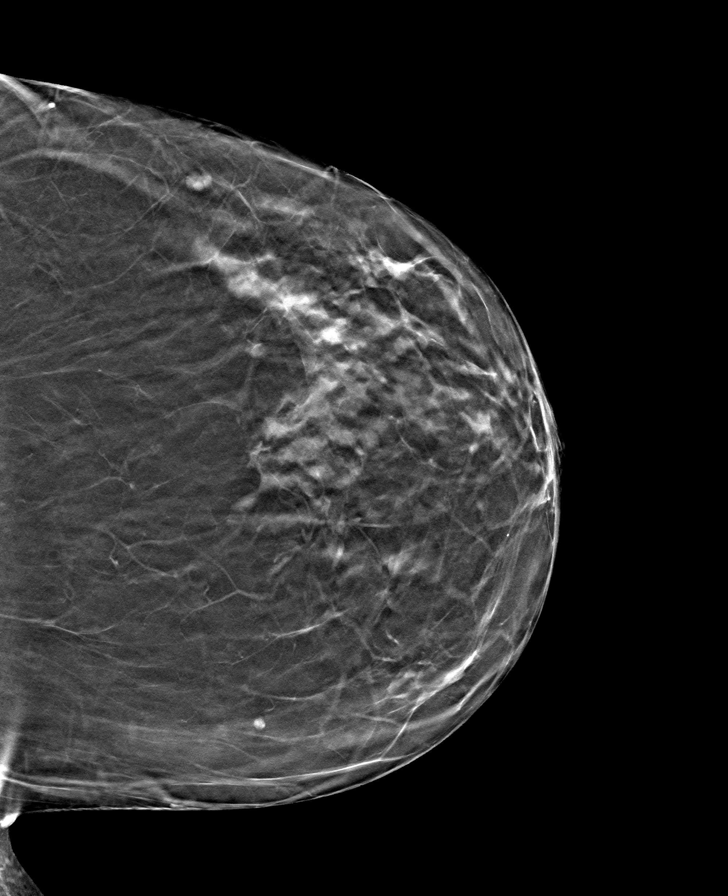

[R CC tomo · tomo slice 29/58.0]
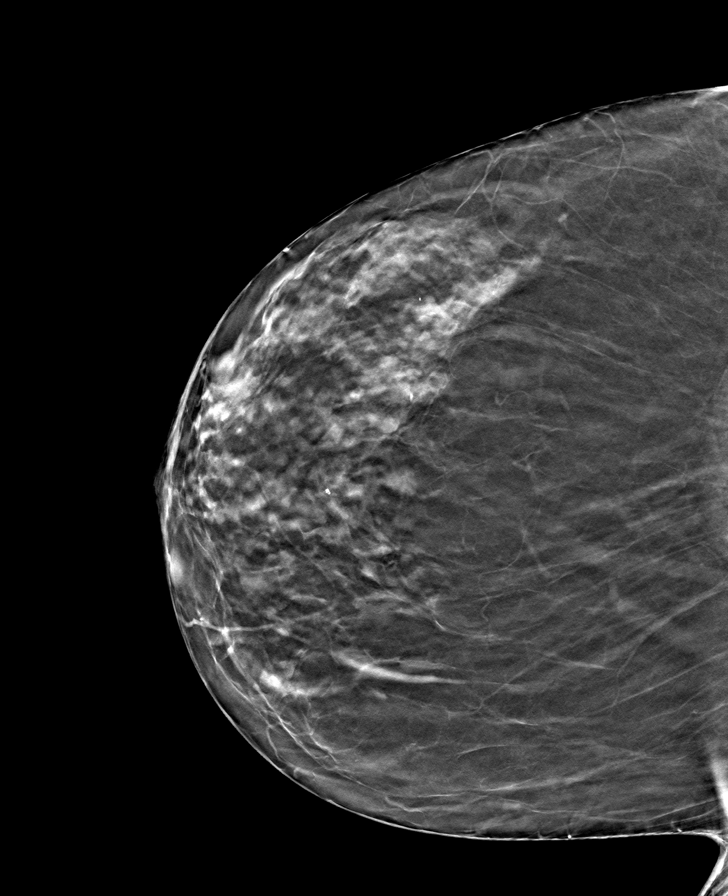

[8 of 24 positions shown; findings below may reference images not displayed]

ACR Breast Density Category b: There are scattered areas of
fibroglandular density.
FINDINGS: There are no findings suspicious for malignancy. Images were
processed with CAD.
IMPRESSION: No mammographic evidence of malignancy. A result letter of this
screening mammogram will be mailed directly to the patient.

RECOMMENDATION:
Screening mammogram in one year. (Code:[id number])

BI-RADS CATEGORY  1: Negative.

## 2021-07-06 DIAGNOSIS — D128 Benign neoplasm of rectum: Secondary | ICD-10-CM | POA: Diagnosis not present

## 2021-07-06 DIAGNOSIS — K5730 Diverticulosis of large intestine without perforation or abscess without bleeding: Secondary | ICD-10-CM | POA: Diagnosis not present

## 2021-07-06 DIAGNOSIS — Z1211 Encounter for screening for malignant neoplasm of colon: Secondary | ICD-10-CM | POA: Diagnosis not present

## 2021-07-06 DIAGNOSIS — K648 Other hemorrhoids: Secondary | ICD-10-CM | POA: Diagnosis not present

## 2021-07-06 DIAGNOSIS — D125 Benign neoplasm of sigmoid colon: Secondary | ICD-10-CM | POA: Diagnosis not present

## 2021-07-06 DIAGNOSIS — D122 Benign neoplasm of ascending colon: Secondary | ICD-10-CM | POA: Diagnosis not present

## 2021-07-09 DIAGNOSIS — D128 Benign neoplasm of rectum: Secondary | ICD-10-CM | POA: Diagnosis not present

## 2021-07-09 DIAGNOSIS — D122 Benign neoplasm of ascending colon: Secondary | ICD-10-CM | POA: Diagnosis not present

## 2021-10-19 ENCOUNTER — Other Ambulatory Visit: Payer: Self-pay | Admitting: Cardiology

## 2021-10-19 DIAGNOSIS — M545 Low back pain, unspecified: Secondary | ICD-10-CM | POA: Diagnosis not present

## 2021-10-19 DIAGNOSIS — R000 Tachycardia, unspecified: Secondary | ICD-10-CM

## 2021-10-19 DIAGNOSIS — R002 Palpitations: Secondary | ICD-10-CM

## 2021-10-19 DIAGNOSIS — M79642 Pain in left hand: Secondary | ICD-10-CM | POA: Diagnosis not present

## 2021-10-19 DIAGNOSIS — M25532 Pain in left wrist: Secondary | ICD-10-CM | POA: Diagnosis not present

## 2021-12-11 ENCOUNTER — Emergency Department (HOSPITAL_BASED_OUTPATIENT_CLINIC_OR_DEPARTMENT_OTHER): Payer: Medicare HMO

## 2021-12-11 ENCOUNTER — Emergency Department (HOSPITAL_BASED_OUTPATIENT_CLINIC_OR_DEPARTMENT_OTHER)
Admission: EM | Admit: 2021-12-11 | Discharge: 2021-12-11 | Disposition: A | Discharge status: Home / Self Care | Payer: Medicare HMO | Attending: Emergency Medicine | Admitting: Emergency Medicine

## 2021-12-11 DIAGNOSIS — K5730 Diverticulosis of large intestine without perforation or abscess without bleeding: Secondary | ICD-10-CM | POA: Diagnosis not present

## 2021-12-11 DIAGNOSIS — D259 Leiomyoma of uterus, unspecified: Secondary | ICD-10-CM | POA: Diagnosis not present

## 2021-12-11 DIAGNOSIS — Z79899 Other long term (current) drug therapy: Secondary | ICD-10-CM | POA: Diagnosis not present

## 2021-12-11 DIAGNOSIS — R109 Unspecified abdominal pain: Secondary | ICD-10-CM

## 2021-12-11 DIAGNOSIS — Z7982 Long term (current) use of aspirin: Secondary | ICD-10-CM | POA: Insufficient documentation

## 2021-12-11 DIAGNOSIS — R1031 Right lower quadrant pain: Secondary | ICD-10-CM | POA: Insufficient documentation

## 2021-12-11 LAB — COMPREHENSIVE METABOLIC PANEL
ALT: 28 U/L (ref 0–44)
AST: 19 U/L (ref 15–41)
Albumin: 4.4 g/dL (ref 3.5–5.0)
Alkaline Phosphatase: 47 U/L (ref 38–126)
Anion gap: 8 (ref 5–15)
BUN: 20 mg/dL (ref 8–23)
CO2: 27 mmol/L (ref 22–32)
Calcium: 9.6 mg/dL (ref 8.9–10.3)
Chloride: 105 mmol/L (ref 98–111)
Creatinine, Ser: 0.71 mg/dL (ref 0.44–1.00)
GFR, Estimated: >60 mL/min (ref >60)
Glucose, Bld: 96 mg/dL (ref 70–99)
Potassium: 4 mmol/L (ref 3.5–5.1)
Sodium: 140 mmol/L (ref 135–145)
Total Bilirubin: 0.5 mg/dL (ref 0.3–1.2)
Total Protein: 7.2 g/dL (ref 6.5–8.1)

## 2021-12-11 LAB — URINALYSIS, ROUTINE W REFLEX MICROSCOPIC
Bilirubin Urine: NEGATIVE
Glucose, UA: NEGATIVE mg/dL
Ketones, ur: NEGATIVE mg/dL
Leukocytes,Ua: NEGATIVE
Nitrite: NEGATIVE
Specific Gravity, Urine: 1.03 (ref 1.005–1.030)
pH: 5.5 (ref 5.0–8.0)

## 2021-12-11 LAB — CBC WITH DIFFERENTIAL/PLATELET
Abs Immature Granulocytes: 0.02 10*3/uL (ref 0.00–0.07)
Basophils Absolute: 0.1 10*3/uL (ref 0.0–0.1)
Basophils Relative: 1 %
Eosinophils Absolute: 0.1 10*3/uL (ref 0.0–0.5)
Eosinophils Relative: 2 %
HCT: 41.9 % (ref 36.0–46.0)
Hemoglobin: 13.6 g/dL (ref 12.0–15.0)
Immature Granulocytes: 0 %
Lymphocytes Relative: 26 %
Lymphs Abs: 2 10*3/uL (ref 0.7–4.0)
MCH: 29.2 pg (ref 26.0–34.0)
MCHC: 32.5 g/dL (ref 30.0–36.0)
MCV: 89.9 fL (ref 80.0–100.0)
Monocytes Absolute: 0.6 10*3/uL (ref 0.1–1.0)
Monocytes Relative: 8 %
Neutro Abs: 5 10*3/uL (ref 1.7–7.7)
Neutrophils Relative %: 63 %
Platelets: 299 10*3/uL (ref 150–400)
RBC: 4.66 MIL/uL (ref 3.87–5.11)
RDW: 13.1 % (ref 11.5–15.5)
WBC: 7.9 10*3/uL (ref 4.0–10.5)
nRBC: 0 % (ref 0.0–0.2)

## 2021-12-11 LAB — LIPASE, BLOOD: Lipase: 24 U/L (ref 11–51)

## 2021-12-11 MED ORDER — SODIUM CHLORIDE 0.9 % IV BOLUS
1000.0000 mL | Freq: Once | INTRAVENOUS | Status: AC
  Administered 2021-12-11: 1000 mL via INTRAVENOUS

## 2021-12-11 MED ORDER — ONDANSETRON HCL 4 MG/2ML IJ SOLN
4.0000 mg | Freq: Once | INTRAMUSCULAR | Status: AC
  Administered 2021-12-11: 4 mg via INTRAVENOUS
  Filled 2021-12-11: qty 2

## 2021-12-11 MED ORDER — DICLOFENAC SODIUM 1 % EX GEL: 4.0000 g | Freq: Four times a day (QID) | CUTANEOUS | 0 refills | Status: DC

## 2021-12-11 MED ORDER — MORPHINE SULFATE (PF) 4 MG/ML IV SOLN
4.0000 mg | Freq: Once | INTRAVENOUS | Status: AC
  Administered 2021-12-11: 4 mg via INTRAVENOUS
  Filled 2021-12-11: qty 1

## 2021-12-11 NOTE — ED Provider Notes (Signed)
[facility] EMERGENCY DEPT Provider Note   CSN: [id number] Arrival date & time: 12/11/21  6606     History  Chief Complaint  Patient presents with   Abdominal Pain    Emily Cruz is a 71 y.o. female.  71 yo F with a chief complaints of right-sided abdominal discomfort.  This is worse to the lower aspect of the abdomen is been going on for about 3 to 4 days now.  Got worse last night to the point where she was very uncomfortable and had trouble finding a comfortable position.  Nothing seems to make this better or worse.  Has had a sensation like she has pressure on her bladder.  Denies dysuria or hesitancy but has had some increased frequency.  No history of kidney stones.  Has had a cholecystectomy in the past.  Denies other abdominal surgery.  Denies trauma.  Denies fevers or chills.  Denies nausea or vomiting.  Denies diarrhea.   Abdominal Pain     Home Medications Prior to Admission medications   Medication Sig Start Date End Date Taking? Authorizing Provider  diclofenac Sodium (VOLTAREN) 1 % GEL Apply 4 g topically 4 (four) times daily. 12/11/21  Yes Deno Etienne, DO  albuterol (VENTOLIN HFA) 108 (90 Base) MCG/ACT inhaler Inhale 2 puffs into the lungs every 6 (six) hours as needed for wheezing or shortness of breath.    [provider]  ALPRAZolam Duanne Moron) 0.25 MG tablet Take 0.125 mg by mouth 2 (two) times daily as needed. 10/28/19   [provider]  aspirin EC 81 MG tablet Take 81 mg by mouth daily. Swallow whole.    [provider]  buPROPion (WELLBUTRIN) 100 MG tablet Take 100 mg by mouth 2 (two) times daily. 10/02/19   [provider]  gabapentin (NEURONTIN) 300 MG capsule Take 300 mg by mouth 2 (two) times daily. 05/07/21   [provider]  pantoprazole (PROTONIX) 40 MG tablet Take 1 tablet by mouth daily. 10/08/18   [provider]  propranolol ER (INDERAL LA) 80 MG 24 hr capsule TAKE 1 CAPSULE BY MOUTH  EVERY DAY 10/19/21   Tolia, Sunit, DO  verapamil (CALAN-SR) 240 MG CR tablet Take 1 tablet (240 mg total) by mouth daily. 05/26/21 07/25/21  Rex Kras, DO      Allergies    Coconut flavor    Review of Systems   Review of Systems  Gastrointestinal:  Positive for abdominal pain.   Physical Exam Updated Vital Signs BP (!) 148/75    Pulse 79    Temp 98 F (36.7 C) (Oral)    Resp 17    Ht 5\' 4"  (1.626 m)    Wt 90.7 kg    SpO2 99%    BMI 34.33 kg/m  Physical Exam Vitals and nursing note reviewed.  Constitutional:      General: She is not in acute distress.    Appearance: She is well-developed. She is not diaphoretic.     Comments: BMI 34  HENT:     Head: Normocephalic and atraumatic.  Eyes:     Pupils: Pupils are equal, round, and reactive to light.  Cardiovascular:     Rate and Rhythm: Normal rate and regular rhythm.     Heart sounds: No murmur heard.   No friction rub. No gallop.  Pulmonary:     Effort: Pulmonary effort is normal.     Breath sounds: No wheezing or rales.  Abdominal:     General: There  is no distension.     Palpations: Abdomen is soft.     Tenderness: There is no abdominal tenderness.     Comments: She points to the right lower abdomen just above the inguinal ligament as the area of most pain.  There is no significant abdominal discomfort on exam.  No appreciable hernia or mass.  Musculoskeletal:        General: No tenderness.     Cervical back: Normal range of motion and neck supple.  Skin:    General: Skin is warm and dry.  Neurological:     Mental Status: She is alert and oriented to person, place, and time.  Psychiatric:        Behavior: Behavior normal.    ED Results / Procedures / Treatments   Labs (all labs ordered are listed, but only abnormal results are displayed) Labs Reviewed  URINALYSIS, ROUTINE W REFLEX MICROSCOPIC - Abnormal; Notable for the following components:      Result Value   Hgb urine dipstick TRACE (*)    Protein, ur TRACE (*)     Bacteria, UA RARE (*)    All other components within normal limits  CBC WITH DIFFERENTIAL/PLATELET  COMPREHENSIVE METABOLIC PANEL  LIPASE, BLOOD    EKG None  Radiology CT Renal Stone Study  Result Date: 12/11/2021 CLINICAL DATA:  71 year old female with history of flank pain radiating into the lower back. EXAM: CT ABDOMEN AND PELVIS WITHOUT CONTRAST TECHNIQUE: Multidetector CT imaging of the abdomen and pelvis was performed following the standard protocol without IV contrast. RADIATION DOSE REDUCTION: This exam was performed according to the departmental dose-optimization program which includes automated exposure control, adjustment of the mA and/or kV according to patient size and/or use of iterative reconstruction technique. COMPARISON:  CT the abdomen and pelvis 02/27/2004. FINDINGS: Lower chest: Unremarkable. Hepatobiliary: No suspicious cystic or solid hepatic lesions are confidently identified on today's noncontrast CT examination. Status post cholecystectomy. Pancreas: No definite pancreatic mass or peripancreatic fluid collections or inflammatory changes are noted on today's noncontrast CT examination. Spleen: Unremarkable. Adrenals/Urinary Tract: There are no abnormal calcifications within the collecting system of either kidney, along the course of either ureter, or within the lumen of the urinary bladder. No hydroureteronephrosis or perinephric stranding to suggest urinary tract obstruction at this time. The unenhanced appearance of the kidneys is unremarkable bilaterally. Unenhanced appearance of the urinary bladder is normal. Left adrenal gland is normal in appearance. 3.4 x 2.4 cm right adrenal mass is intermediate attenuation (21 HU), and new compared to the prior examination. Stomach/Bowel: Unenhanced appearance of the stomach is normal. There is no pathologic dilatation of small bowel or colon. Numerous colonic diverticulae are noted, without surrounding inflammatory changes to  suggest an acute diverticulitis at this time. Normal appendix. Vascular/Lymphatic: Aortic atherosclerosis. No lymphadenopathy noted in the abdomen or pelvis. Reproductive: Uterus is mildly enlarged and heterogeneous in appearance with multiple internal lesions, several of which are partially calcified, presumably multifocal fibroids. The largest lesion is slightly exophytic in the left side of the uterine fundus measuring up to 3.7 cm in diameter. Ovaries are atrophic. Other: No significant volume of ascites.  No pneumoperitoneum. Musculoskeletal: There are no aggressive appearing lytic or blastic lesions noted in the visualized portions of the skeleton. IMPRESSION: 1. No acute findings are noted in the abdomen or pelvis to account for the patient's symptoms. 2. Colonic diverticulosis without evidence of acute diverticulitis at this time. 3. 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 Follow-up nonemergent outpatient adrenal protocol CT scan is recommended for further characterization. 4. Fibroid uterus again noted. 5. Aortic atherosclerosis. Electronically Signed   By: Vinnie Langton M.D.   On: 12/11/2021 05:48    Procedures Procedures    Medications Ordered in ED Medications  sodium chloride 0.9 % bolus 1,000 mL (0 mLs Intravenous Stopped 12/11/21 0631)  morphine (PF) 4 MG/ML injection 4 mg (4 mg Intravenous Given 12/11/21 0526)  ondansetron (ZOFRAN) injection 4 mg (4 mg Intravenous Given 12/11/21 0524)    ED Course/ Medical Decision Making/ A&P                           Medical Decision Making Amount and/or Complexity of Data Reviewed Labs: ordered. Radiology: ordered.  Risk Prescription drug management.   Patient is a 71 y.o. female with a cc of right lower quadrant abdominal discomfort.  Going on for about 3 to 4 days.  No fevers no nausea or vomiting.  Could be musculoskeletal based on history and physical other possibility is  kidney stone.  We will obtain a CT stone study to evaluate for underlying pathology.  Blood work IV fluids UA treat with IV narcotics and antiemetics.  Reassess.  No anemia, no leukocytosis.  LFT and lipase unremarkable.  UA independently interpreted by me without infection.  Patient feeling better on reassessment.  ? Musculoskeletal.  Has follow up with the PCP on Monday.   6:34 AM:  I have discussed the diagnosis/risks/treatment options with the patient.  Evaluation and diagnostic testing in the emergency department does not suggest an emergent condition requiring admission or immediate intervention beyond what has been performed at this time.  They will follow up with  PCP. We also discussed returning to the ED immediately if new or worsening sx occur. We discussed the sx which are most concerning (e.g., sudden worsening pain, fever, inability to tolerate by mouth) that necessitate immediate return. Medications administered to the patient during their visit and any new prescriptions provided to the patient are listed below.  Medications given during this visit Medications  sodium chloride 0.9 % bolus 1,000 mL (0 mLs Intravenous Stopped 12/11/21 0631)  morphine (PF) 4 MG/ML injection 4 mg (4 mg Intravenous Given 12/11/21 0526)  ondansetron (ZOFRAN) injection 4 mg (4 mg Intravenous Given 12/11/21 0524)     The patient appears reasonably screen and/or stabilized for discharge and I doubt any other medical condition or other [facility] requiring further screening, evaluation, or treatment in the ED at this time prior to discharge.          Final Clinical Impression(s) / ED Diagnoses Final diagnoses:  Right sided abdominal pain    Rx / DC Orders ED Discharge Orders          Ordered    diclofenac Sodium (VOLTAREN) 1 % GEL  4 times daily        12/11/21 0623              Deno Etienne, DO 12/11/21 [phone]

## 2021-12-11 NOTE — Discharge Instructions (Addendum)
Your CT did not show an issue with your appendix, or a kidney stone.  It did show uterine fibroids and a right adrenal cyst.  They fibroids can by followed up with obgyn and talk with your family doctor about the adrenal cyst they may want to get a different image of it.   I have prescribed a gel that you can rub right where it hurts.  You can also try tylenol.  See your doctor as scheduled on Monday.  Return for worsening pain, fever, inability to eat or drink.   Use the gel as prescribed. Also take tylenol up to 1000mg (2 extra strength) four times a day.

## 2021-12-11 NOTE — ED Triage Notes (Signed)
Pt reports 7/10 RLQ pain since Wednesday that radiated to her lower back last night. Pt reports minimal relief with ibuprofen. Pt denies N/V/D or constipation, states that it feels like there is pressure on her bladder.

## 2021-12-11 NOTE — ED Notes (Signed)
Patient transported to CT via wheelchair with tech

## 2021-12-13 DIAGNOSIS — Z0000 Encounter for general adult medical examination without abnormal findings: Secondary | ICD-10-CM | POA: Diagnosis not present

## 2021-12-13 DIAGNOSIS — M549 Dorsalgia, unspecified: Secondary | ICD-10-CM | POA: Diagnosis not present

## 2021-12-13 DIAGNOSIS — Z23 Encounter for immunization: Secondary | ICD-10-CM | POA: Diagnosis not present

## 2021-12-13 DIAGNOSIS — E785 Hyperlipidemia, unspecified: Secondary | ICD-10-CM | POA: Diagnosis not present

## 2021-12-13 DIAGNOSIS — E278 Other specified disorders of adrenal gland: Secondary | ICD-10-CM | POA: Diagnosis not present

## 2021-12-13 DIAGNOSIS — D259 Leiomyoma of uterus, unspecified: Secondary | ICD-10-CM | POA: Diagnosis not present

## 2021-12-13 DIAGNOSIS — R1030 Lower abdominal pain, unspecified: Secondary | ICD-10-CM | POA: Diagnosis not present

## 2021-12-15 ENCOUNTER — Emergency Department (HOSPITAL_BASED_OUTPATIENT_CLINIC_OR_DEPARTMENT_OTHER)
Admission: EM | Admit: 2021-12-15 | Discharge: 2021-12-15 | Disposition: A | Discharge status: Home / Self Care | Payer: Medicare HMO | Attending: Emergency Medicine | Admitting: Emergency Medicine

## 2021-12-15 ENCOUNTER — Other Ambulatory Visit: Payer: Self-pay

## 2021-12-15 ENCOUNTER — Emergency Department (HOSPITAL_BASED_OUTPATIENT_CLINIC_OR_DEPARTMENT_OTHER): Payer: Medicare HMO

## 2021-12-15 ENCOUNTER — Other Ambulatory Visit (HOSPITAL_BASED_OUTPATIENT_CLINIC_OR_DEPARTMENT_OTHER): Payer: Self-pay

## 2021-12-15 ENCOUNTER — Encounter (HOSPITAL_BASED_OUTPATIENT_CLINIC_OR_DEPARTMENT_OTHER): Payer: Self-pay

## 2021-12-15 DIAGNOSIS — R202 Paresthesia of skin: Secondary | ICD-10-CM | POA: Insufficient documentation

## 2021-12-15 DIAGNOSIS — I10 Essential (primary) hypertension: Secondary | ICD-10-CM | POA: Diagnosis not present

## 2021-12-15 DIAGNOSIS — F419 Anxiety disorder, unspecified: Secondary | ICD-10-CM | POA: Insufficient documentation

## 2021-12-15 DIAGNOSIS — R1031 Right lower quadrant pain: Secondary | ICD-10-CM | POA: Insufficient documentation

## 2021-12-15 DIAGNOSIS — J45909 Unspecified asthma, uncomplicated: Secondary | ICD-10-CM | POA: Insufficient documentation

## 2021-12-15 DIAGNOSIS — Z79899 Other long term (current) drug therapy: Secondary | ICD-10-CM | POA: Insufficient documentation

## 2021-12-15 DIAGNOSIS — R102 Pelvic and perineal pain: Secondary | ICD-10-CM | POA: Diagnosis not present

## 2021-12-15 DIAGNOSIS — M5030 Other cervical disc degeneration, unspecified cervical region: Secondary | ICD-10-CM | POA: Insufficient documentation

## 2021-12-15 DIAGNOSIS — D259 Leiomyoma of uterus, unspecified: Secondary | ICD-10-CM | POA: Diagnosis not present

## 2021-12-15 DIAGNOSIS — F32A Depression, unspecified: Secondary | ICD-10-CM | POA: Insufficient documentation

## 2021-12-15 DIAGNOSIS — Z7982 Long term (current) use of aspirin: Secondary | ICD-10-CM | POA: Diagnosis not present

## 2021-12-15 MED ORDER — OXYCODONE-ACETAMINOPHEN 5-325 MG PO TABS
1.0000 | ORAL_TABLET | Freq: Four times a day (QID) | ORAL | 0 refills | Status: DC | PRN
  Filled 2021-12-15: qty 8, 2d supply, fill #0

## 2021-12-15 NOTE — ED Provider Notes (Signed)
?  Physical Exam  ?BP (!) 146/74   Pulse 80   Temp 98.3 ?F (36.8 ?C) (Oral)   Resp 18   Ht 1.626 m (5\' 4" )   Wt 93.4 kg   SpO2 97%   BMI 35.36 kg/m?  ? ?Physical Exam ? ?Procedures  ?Procedures ? ?ED Course / MDM  ?  ?Medical Decision Making ?Amount and/or Complexity of Data Reviewed ?Radiology: ordered. ?ECG/medicine tests: ordered. ? ?Risk ?Prescription drug management. ? ? ?71 y/o female presented with right inguinal area pain.  Patient had been seen for similar pain 2/25 and ct with fibroids and adrenal mass.  Patient has follow up today for adrenal mass ?Taking tramadol at home ?Came in due to increased pain ?[place] pending. ?Patient drove herself here and did not initially want anything that prevented her from driving self home ?Will follow up US and likely d/c home if no acute abnormality, or etiology of pain. ?Ultrasound reviewed, report reviewed, report discussed with patient. ?Discussed need for follow-up with patient ?She is following up with her gynecologist this afternoon. ?She is not taking anything at home for pain that has worked.  She was prescribed Vicodin but [facility] was out she is given a prescription for 8 Percocet pills here.  We have discussed return precautions follow-up and she voices understanding. ? ? ? ? ? ?  ?Pattricia Boss, MD ?12/15/21 [phone] ? ?

## 2021-12-15 NOTE — ED Provider Notes (Signed)
? ?[facility] ?Provider Note: Georgena Spurling, MD, [place] ? ?CSN: [id number] ?MRN: [id number] ?ARRIVAL: 12/15/21 at [facility] ?ROOM: [id number] ? ? ?CHIEF COMPLAINT  ?Abdominal Pain ? ? ?HISTORY OF PRESENT ILLNESS  ?12/15/21 5:34 AM ?Emily Cruz is a 71 y.o. female who was seen in the ED on 12/11/2021 for right lower quadrant abdominal pain that been going on for about 3 to 4 days.  Laboratory studies were normal and CT scan showed known uterine fibroids and new right adrenal mass but no acute findings to account for the patient's symptoms. ? ?She returns with persistent abdominal pain in the right lower quadrant.  She rates the pain as a 9 out of 10.  It is worse with movement or palpation.  She also feels numbness in her right groin and right medial thigh.  She denies back pain.  She denies nausea, vomiting, diarrhea, dysuria, hematuria, vaginal bleeding, fever or chills.  She is scheduled to see her OB/GYN at 2 PM today but found the pain to be unbearable.  It is not relieved with Tylenol, ibuprofen or tramadol. ? ? ?Past Medical History:  ?Diagnosis Date  ? Asthma   ? Hyperlipidemia   ? Hypertension   ? ? ?Past Surgical History:  ?Procedure Laterality Date  ? BREAST LUMPECTOMY Left 1976  ? CATARACT EXTRACTION    ? CHOLECYSTECTOMY  2005  ? TONSILLECTOMY AND ADENOIDECTOMY  1958  ? ? ?Family History  ?Problem Relation Age of Onset  ? Diabetes Mother   ? Heart disease Mother   ?     2017    2 stents  ? Hypertension Paternal Grandmother   ? Stroke Paternal Grandmother   ? Emphysema Paternal Grandmother   ? ? ?Social History  ? ?Tobacco Use  ? Smoking status: Never  ? Smokeless tobacco: Never  ?Vaping Use  ? Vaping Use: Never used  ?Substance Use Topics  ? Alcohol use: Never  ? Drug use: Never  ? ? ?Prior to Admission medications   ?Medication Sig Start Date End Date Taking? Authorizing Provider  ?oxyCODONE-acetaminophen (PERCOCET/ROXICET) 5-325 MG tablet Take 1 tablet by mouth every 6 (six) hours as needed for  severe pain. 12/15/21  Yes Pattricia Boss, MD  ?albuterol (VENTOLIN HFA) 108 (90 Base) MCG/ACT inhaler Inhale 2 puffs into the lungs every 6 (six) hours as needed for wheezing or shortness of breath.    [provider]  ?ALPRAZolam Duanne Moron) 0.25 MG tablet Take 0.125 mg by mouth 2 (two) times daily as needed. 10/28/19   [provider]  ?aspirin EC 81 MG tablet Take 81 mg by mouth daily. Swallow whole.    [provider]  ?buPROPion (WELLBUTRIN) 100 MG tablet Take 100 mg by mouth 2 (two) times daily. 10/02/19   [provider]  ?diclofenac Sodium (VOLTAREN) 1 % GEL Apply 4 g topically 4 (four) times daily. 12/11/21   Deno Etienne, DO  ?gabapentin (NEURONTIN) 300 MG capsule Take 300 mg by mouth 2 (two) times daily. 05/07/21   [provider]  ?pantoprazole (PROTONIX) 40 MG tablet Take 1 tablet by mouth daily. 10/08/18   [provider]  ?propranolol ER (INDERAL LA) 80 MG 24 hr capsule TAKE 1 CAPSULE BY MOUTH EVERY DAY 10/19/21   Tolia, Sunit, DO  ?verapamil (CALAN-SR) 240 MG CR tablet Take 1 tablet (240 mg total) by mouth daily. 05/26/21 07/25/21  Rex Kras, DO  ? ? ?Allergies ?Coconut flavor ? ? ?REVIEW OF SYSTEMS  ?Negative except as noted  here or in the History of Present Illness. ? ? ?PHYSICAL EXAMINATION  ?Initial Vital Signs ?Blood pressure (!) 173/80, pulse 86, temperature 98.3 ?F (36.8 ?C), temperature source Oral, resp. rate 16, height 5\' 4"  (1.626 m), weight 93.4 kg, SpO2 98 %. ? ?Examination ?General: Well-developed, well-nourished female in no acute distress; appearance consistent with age of record ?HENT: normocephalic; atraumatic ?Eyes: Normal appearance ?Neck: supple ?Heart: regular rate and rhythm ?Lungs: clear to auscultation bilaterally ?Abdomen: soft; nondistended; right suprapubic tenderness; bowel sounds present ?Extremities: No deformity; full range of motion; pulses normal ?Neurologic: Awake, alert and oriented; motor function intact in all extremities  and symmetric; no facial droop ?Skin: Warm and dry ?Psychiatric: Normal mood and affect ? ? ?RESULTS  ?Summary of this visit's results, reviewed and interpreted by myself: ? ? EKG Interpretation ? ?Date/Time:    ?Ventricular Rate:    ?PR Interval:    ?QRS Duration:   ?QT Interval:    ?QTC Calculation:   ?R Axis:     ?Text Interpretation:   ?  ? ?  ? ?Laboratory Studies: ?No results found for this or any previous visit (from the past 24 hour(s)). ?Imaging Studies: ?US PELVIC COMPLETE W TRANSVAGINAL AND TORSION R/O ? ?Result Date: 12/15/2021 ?CLINICAL DATA:  Right pelvic pain for 1 week. EXAM: TRANSABDOMINAL AND TRANSVAGINAL ULTRASOUND OF PELVIS DOPPLER ULTRASOUND OF OVARIES TECHNIQUE: Both transabdominal and transvaginal ultrasound examinations of the pelvis were performed. Transabdominal technique was performed for global imaging of the pelvis including uterus, ovaries, adnexal regions, and pelvic cul-de-sac. It was necessary to proceed with endovaginal exam following the transabdominal exam to visualize the endometrium and ovaries. Color and duplex Doppler ultrasound was utilized to evaluate blood flow to the ovaries. COMPARISON:  CT abdomen 12/11/2021 FINDINGS: Uterus Measurements: 8.2 x 4.6 x 6.6 cm = volume: 130.4 mL. Multiple hypoechoic uterine masses consistent with fibroids. 2.1 x 2.3 x 2.3 cm uterine fundal fibroid. 2.5 x 3.1 x 23.8 cm uterine fundal fibroid. 3.8 x 3.9 x 3.8 cm right uterine fundal fibroid posteriorly. Endometrium Not well visualized. Right ovary Not visualized Left ovary Measurements: 2.1 x 1 x 1.1 cm = volume: 1.2 mL. Normal appearance/no adnexal mass. Pulsed Doppler evaluation of both ovaries demonstrates normal low-resistance arterial and venous waveforms. Other findings No abnormal free fluid. IMPRESSION: 1. No sonographic findings to explain the patient's right pelvic pain. 2. Fibroid uterus. 3. Right ovary is not visualized. 4. Normal left ovary. Electronically Signed   By: Kathreen Devoid  M.D.   On: 12/15/2021 07:30   ? ?ED COURSE and MDM  ?Nursing notes, initial and subsequent vitals signs, including pulse oximetry, reviewed and interpreted by myself. ? ?Vitals:  ? 12/15/21 3428 12/15/21 0645 12/15/21 0708 12/15/21 0750  ?BP: (!) 148/71 (!) 146/74 (!) 144/73   ?Pulse: 85 80 79   ?Resp: 18 18 16    ?Temp:    98.2 ?F (36.8 ?C)  ?TempSrc:    Oral  ?SpO2: 98% 97% 98%   ?Weight:      ?Height:      ? ?Medications - No data to display ? ?7:00 AM ?Signed out to Dr. Jeanell Sparrow.  Ultrasound of the pelvis pending to evaluate the uterus and adnexae.  Patient will follow-up this afternoon with her OB/GYN unless the ultrasound shows something requiring urgent intervention. ? ?PROCEDURES  ?Procedures ? ? ?ED DIAGNOSES  ? ?  ICD-10-CM   ?1. Pelvic pain  R10.2 US PELVIC COMPLETE W TRANSVAGINAL AND TORSION R/O  ?  US  PELVIC COMPLETE W TRANSVAGINAL AND TORSION R/O  ?  ? ? ? ?  ?Shanon Rosser, MD ?12/15/21 2236 ? ?

## 2021-12-15 NOTE — Discharge Instructions (Signed)
Fibroid were seen on your ultrasound today.  On your CT several days ago, your appendix was identified and normal ?Please use the Percocet as needed for pain ?Please follow-up with your gynecologist as scheduled today ?Return if you are having worsening pain, fever, other new symptoms. ? ?

## 2021-12-15 NOTE — ED Notes (Signed)
Patient transported to Ultrasound 

## 2021-12-15 NOTE — ED Triage Notes (Signed)
Generalized abdominal pain but focused on the right lower abdomen.  Denies nausea, vomiting, diarrhea, vaginal bleeding or urinary symptoms. Pain started one week ago, was seen here and diagnosed with uterine fibroids and an right adrenal mass. ?

## 2021-12-16 DIAGNOSIS — M25552 Pain in left hip: Secondary | ICD-10-CM | POA: Diagnosis not present

## 2021-12-16 DIAGNOSIS — M25551 Pain in right hip: Secondary | ICD-10-CM | POA: Diagnosis not present

## 2021-12-16 DIAGNOSIS — M545 Low back pain, unspecified: Secondary | ICD-10-CM | POA: Diagnosis not present

## 2021-12-17 DIAGNOSIS — R1031 Right lower quadrant pain: Secondary | ICD-10-CM | POA: Diagnosis not present

## 2021-12-30 ENCOUNTER — Other Ambulatory Visit: Payer: Self-pay | Admitting: Family Medicine

## 2021-12-30 ENCOUNTER — Other Ambulatory Visit: Payer: Self-pay | Admitting: Cardiology

## 2022-01-18 DIAGNOSIS — Z6836 Body mass index (BMI) 36.0-36.9, adult: Secondary | ICD-10-CM | POA: Diagnosis not present

## 2022-01-18 DIAGNOSIS — Z124 Encounter for screening for malignant neoplasm of cervix: Secondary | ICD-10-CM | POA: Diagnosis not present

## 2022-01-18 DIAGNOSIS — Z01419 Encounter for gynecological examination (general) (routine) without abnormal findings: Secondary | ICD-10-CM | POA: Diagnosis not present

## 2022-01-18 DIAGNOSIS — R69 Illness, unspecified: Secondary | ICD-10-CM | POA: Diagnosis not present

## 2022-01-19 DIAGNOSIS — R69 Illness, unspecified: Secondary | ICD-10-CM | POA: Diagnosis not present

## 2022-01-19 DIAGNOSIS — E785 Hyperlipidemia, unspecified: Secondary | ICD-10-CM | POA: Diagnosis not present

## 2022-01-19 DIAGNOSIS — F411 Generalized anxiety disorder: Secondary | ICD-10-CM | POA: Diagnosis not present

## 2022-01-21 ENCOUNTER — Ambulatory Visit
Admission: RE | Admit: 2022-01-21 | Discharge: 2022-01-21 | Disposition: A | Discharge status: Home / Self Care | Payer: Medicare HMO | Source: Ambulatory Visit | Attending: Family Medicine | Admitting: Family Medicine

## 2022-01-21 DIAGNOSIS — E279 Disorder of adrenal gland, unspecified: Secondary | ICD-10-CM | POA: Diagnosis not present

## 2022-01-21 DIAGNOSIS — E278 Other specified disorders of adrenal gland: Secondary | ICD-10-CM

## 2022-01-21 MED ORDER — IOPAMIDOL (ISOVUE-300) INJECTION 61%
100.0000 mL | Freq: Once | INTRAVENOUS | Status: AC | PRN
  Administered 2022-01-21: 100 mL via INTRAVENOUS

## 2022-02-21 DIAGNOSIS — E785 Hyperlipidemia, unspecified: Secondary | ICD-10-CM | POA: Diagnosis not present

## 2022-03-08 DIAGNOSIS — M180 Bilateral primary osteoarthritis of first carpometacarpal joints: Secondary | ICD-10-CM | POA: Diagnosis not present

## 2022-03-08 DIAGNOSIS — M1811 Unilateral primary osteoarthritis of first carpometacarpal joint, right hand: Secondary | ICD-10-CM | POA: Diagnosis not present

## 2022-03-08 DIAGNOSIS — M1812 Unilateral primary osteoarthritis of first carpometacarpal joint, left hand: Secondary | ICD-10-CM | POA: Diagnosis not present

## 2022-03-15 ENCOUNTER — Other Ambulatory Visit: Payer: Self-pay | Admitting: Cardiology

## 2022-03-15 DIAGNOSIS — R002 Palpitations: Secondary | ICD-10-CM

## 2022-03-15 DIAGNOSIS — R000 Tachycardia, unspecified: Secondary | ICD-10-CM

## 2022-05-27 ENCOUNTER — Other Ambulatory Visit: Payer: Self-pay | Admitting: Cardiology

## 2022-05-27 DIAGNOSIS — R002 Palpitations: Secondary | ICD-10-CM

## 2022-05-27 DIAGNOSIS — R000 Tachycardia, unspecified: Secondary | ICD-10-CM

## 2022-06-13 ENCOUNTER — Other Ambulatory Visit: Payer: Self-pay | Admitting: Family Medicine

## 2022-06-13 DIAGNOSIS — H9313 Tinnitus, bilateral: Secondary | ICD-10-CM | POA: Diagnosis not present

## 2022-06-13 DIAGNOSIS — E782 Mixed hyperlipidemia: Secondary | ICD-10-CM | POA: Diagnosis not present

## 2022-06-13 DIAGNOSIS — E279 Disorder of adrenal gland, unspecified: Secondary | ICD-10-CM | POA: Diagnosis not present

## 2022-06-13 DIAGNOSIS — G8929 Other chronic pain: Secondary | ICD-10-CM | POA: Diagnosis not present

## 2022-06-13 DIAGNOSIS — Z1231 Encounter for screening mammogram for malignant neoplasm of breast: Secondary | ICD-10-CM

## 2022-06-13 DIAGNOSIS — M545 Low back pain, unspecified: Secondary | ICD-10-CM | POA: Diagnosis not present

## 2022-06-13 DIAGNOSIS — E2839 Other primary ovarian failure: Secondary | ICD-10-CM | POA: Diagnosis not present

## 2022-06-15 ENCOUNTER — Other Ambulatory Visit: Payer: Self-pay | Admitting: Family Medicine

## 2022-06-15 DIAGNOSIS — E2839 Other primary ovarian failure: Secondary | ICD-10-CM

## 2022-06-21 ENCOUNTER — Other Ambulatory Visit: Payer: Self-pay | Admitting: Family Medicine

## 2022-06-21 DIAGNOSIS — E279 Disorder of adrenal gland, unspecified: Secondary | ICD-10-CM

## 2022-07-05 ENCOUNTER — Ambulatory Visit: Payer: Medicare HMO

## 2022-07-07 ENCOUNTER — Ambulatory Visit
Admission: RE | Admit: 2022-07-07 | Discharge: 2022-07-07 | Disposition: A | Discharge status: Home / Self Care | Payer: Medicare HMO | Source: Ambulatory Visit | Attending: Family Medicine | Admitting: Family Medicine

## 2022-07-07 DIAGNOSIS — D3501 Benign neoplasm of right adrenal gland: Secondary | ICD-10-CM | POA: Diagnosis not present

## 2022-07-07 DIAGNOSIS — E279 Disorder of adrenal gland, unspecified: Secondary | ICD-10-CM

## 2022-07-07 MED ORDER — GADOBENATE DIMEGLUMINE 529 MG/ML IV SOLN
19.0000 mL | Freq: Once | INTRAVENOUS | Status: AC | PRN
  Administered 2022-07-07: 19 mL via INTRAVENOUS

## 2022-07-14 ENCOUNTER — Other Ambulatory Visit: Payer: Self-pay | Admitting: Family Medicine

## 2022-07-14 DIAGNOSIS — R42 Dizziness and giddiness: Secondary | ICD-10-CM

## 2022-07-14 DIAGNOSIS — Z23 Encounter for immunization: Secondary | ICD-10-CM | POA: Diagnosis not present

## 2022-07-14 DIAGNOSIS — R4789 Other speech disturbances: Secondary | ICD-10-CM | POA: Diagnosis not present

## 2022-07-14 DIAGNOSIS — M542 Cervicalgia: Secondary | ICD-10-CM | POA: Diagnosis not present

## 2022-07-21 ENCOUNTER — Ambulatory Visit
Admission: RE | Admit: 2022-07-21 | Discharge: 2022-07-21 | Disposition: A | Discharge status: Home / Self Care | Payer: Medicare HMO | Source: Ambulatory Visit | Attending: Family Medicine | Admitting: Family Medicine

## 2022-07-21 DIAGNOSIS — I6523 Occlusion and stenosis of bilateral carotid arteries: Secondary | ICD-10-CM | POA: Diagnosis not present

## 2022-07-21 DIAGNOSIS — R42 Dizziness and giddiness: Secondary | ICD-10-CM

## 2022-07-27 ENCOUNTER — Ambulatory Visit
Admission: RE | Admit: 2022-07-27 | Discharge: 2022-07-27 | Disposition: A | Discharge status: Home / Self Care | Payer: Medicare HMO | Source: Ambulatory Visit | Attending: Family Medicine | Admitting: Family Medicine

## 2022-07-27 ENCOUNTER — Ambulatory Visit: Payer: Medicare HMO | Admitting: Neurology

## 2022-07-27 ENCOUNTER — Encounter: Payer: Self-pay | Admitting: Neurology

## 2022-07-27 ENCOUNTER — Telehealth: Payer: Self-pay | Admitting: Neurology

## 2022-07-27 VITALS — BP 159/79 | HR 75 | Ht 64.0 in | Wt 202.5 lb

## 2022-07-27 DIAGNOSIS — Z1231 Encounter for screening mammogram for malignant neoplasm of breast: Secondary | ICD-10-CM | POA: Diagnosis not present

## 2022-07-27 DIAGNOSIS — R404 Transient alteration of awareness: Secondary | ICD-10-CM | POA: Diagnosis not present

## 2022-07-27 NOTE — Progress Notes (Signed)
[facility]  PATIENT: Emily Cruz DOB: [birth year]  REQUESTING CLINICIAN: Saintclair Halsted, FNP HISTORY FROM: Patient  REASON FOR VISIT: Alteration of awareness    HISTORICAL  CHIEF COMPLAINT:  Chief Complaint  Patient presents with   New Patient (Initial Visit)    Rm 12. Alone. NP/Paper/Romana Amedeo Plenty FNP [facility]/recurring dizzy spells and garbled speech. States she has had 23 of these short spells over the last 3 months. C/o tiredness afterwards. Denies facial drooping. Denies dizziness upon standing. Has ringing in ears, congested right ear.    HISTORY OF PRESENT ILLNESS:  This is a 71 year old woman with past medical history of hypertension, hyperlipidemia, and neuropathy who is presenting with transient alteration of sensation. Patient reports experiencing episodes of vertigo that comes in waves, followed by slurred speech or difficulty with speech. These episodes last between 5 to 10 seconds and afterward she is tired for about 15 to 20 minutes. Her thoughts are also all over the place per patient. She reports initially these episodes started in 2018 and 2019 and was sporadic. She did not have any episode since 2020 but but since May 03 2022, they have been more frequent. . On september 30, she had a total of 4 episodes.  She denies any previous history of seizures, no history of brain trauma, but noted that grandfather and niece had seizures.    OTHER MEDICAL CONDITIONS: Hyperlipidemia, Nerve pain, hypertension    REVIEW OF SYSTEMS: Full 14 system review of systems performed and negative with exception of: As noted in the HPI   ALLERGIES: Allergies  Allergen Reactions   Coconut Flavor Anaphylaxis    food   Coconut (Cocos Nucifera)     HOME MEDICATIONS: Outpatient Medications Prior to Visit  Medication Sig Dispense Refill   albuterol (VENTOLIN HFA) 108 (90 Base) MCG/ACT inhaler Inhale 2 puffs into the lungs every 6 (six) hours as  needed for wheezing or shortness of breath.     ALPRAZolam (XANAX) 0.25 MG tablet Take 0.125 mg by mouth 2 (two) times daily as needed.     aspirin EC 81 MG tablet Take 81 mg by mouth daily. Swallow whole.     buPROPion (WELLBUTRIN) 100 MG tablet Take 100 mg by mouth 2 (two) times daily.     gabapentin (NEURONTIN) 600 MG tablet Take 600 mg by mouth 2 (two) times daily.     pantoprazole (PROTONIX) 40 MG tablet Take 1 tablet by mouth daily.     pravastatin (PRAVACHOL) 20 MG tablet Take 20 mg by mouth daily.     propranolol ER (INDERAL LA) 80 MG 24 hr capsule TAKE 1 CAPSULE BY MOUTH EVERY DAY 90 capsule 0   verapamil (CALAN-SR) 240 MG CR tablet Take 1 tablet (240 mg total) by mouth daily. 60 tablet 0   diclofenac Sodium (VOLTAREN) 1 % GEL Apply 4 g topically 4 (four) times daily. 100 g 0   oxyCODONE-acetaminophen (PERCOCET/ROXICET) 5-325 MG tablet Take 1 tablet by mouth every 6 (six) hours as needed for severe pain. 8 tablet 0   No facility-administered medications prior to visit.    PAST MEDICAL HISTORY: Past Medical History:  Diagnosis Date   Asthma    Hyperlipidemia    Hypertension     PAST SURGICAL HISTORY: Past Surgical History:  Procedure Laterality Date   BREAST LUMPECTOMY Left 1976   CATARACT EXTRACTION     CHOLECYSTECTOMY  2005   [place]  HISTORY: Family History  Problem Relation Age of Onset   Diabetes Mother    Heart disease Mother        2017    2 stents   Hypertension Paternal Grandmother    Stroke Paternal Grandmother    Emphysema Paternal Grandmother     SOCIAL HISTORY: Social History   Socioeconomic History   Marital status: Single    Spouse name: Not on file   Number of children: 0   Years of education: Not on file   Highest education level: Not on file  Occupational History   Not on file  Tobacco Use   Smoking status: Never   Smokeless tobacco: Never  Vaping Use   Vaping Use: Never used  Substance and  Sexual Activity   Alcohol use: Never   Drug use: Never   Sexual activity: Not on file  Other Topics Concern   Not on file  Social History Narrative   Not on file   Social Determinants of Health   Financial Resource Strain: Not on file  Food Insecurity: Not on file  Transportation Needs: Not on file  Physical Activity: Not on file  Stress: Not on file  Social Connections: Not on file  Intimate Partner Violence: Not on file    PHYSICAL EXAM  GENERAL EXAM/CONSTITUTIONAL: Vitals:  Vitals:   07/27/22 1516  BP: (!) 159/79  Pulse: 75  Weight: 202 lb 8 oz (91.9 kg)  Height: '5\' 4"'$  (1.626 m)   Body mass index is 34.76 kg/m. Wt Readings from Last 3 Encounters:  07/27/22 202 lb 8 oz (91.9 kg)  12/15/21 206 lb (93.4 kg)  12/11/21 200 lb (90.7 kg)   Patient is in no distress; well developed, nourished and groomed; neck is supple  EYES: Pupils round and reactive to light, Visual fields full to confrontation, Extraocular movements intacts,   MUSCULOSKELETAL: Gait, strength, tone, movements noted in Neurologic exam below  NEUROLOGIC: MENTAL STATUS:      No data to display         awake, alert, oriented to person, place and time recent and remote memory intact normal attention and concentration language fluent, comprehension intact, naming intact fund of knowledge appropriate  CRANIAL NERVE:  2nd, 3rd, 4th, 6th - pupils equal and reactive to light, visual fields full to confrontation, extraocular muscles intact, no nystagmus 5th - facial sensation symmetric 7th - facial strength symmetric 8th - hearing intact 9th - palate elevates symmetrically, uvula midline 11th - shoulder shrug symmetric 12th - tongue protrusion midline  MOTOR:  normal bulk and tone, full strength in the BUE, BLE  SENSORY:  normal and symmetric to light touch  COORDINATION:  finger-nose-finger, fine finger movements normal  REFLEXES:  deep tendon reflexes present and  symmetric  GAIT/STATION:  normal   DIAGNOSTIC DATA (LABS, IMAGING, TESTING) - I reviewed patient records, labs, notes, testing and imaging myself where available.  Lab Results  Component Value Date   WBC 7.9 12/11/2021   HGB 13.6 12/11/2021   HCT 41.9 12/11/2021   MCV 89.9 12/11/2021   PLT 299 12/11/2021      Component Value Date/Time   NA 140 12/11/2021 0513   NA 145 (H) 12/12/2019 1138   K 4.0 12/11/2021 0513   CL 105 12/11/2021 0513   CO2 27 12/11/2021 0513   GLUCOSE 96 12/11/2021 0513   BUN 20 12/11/2021 0513   BUN 22 12/12/2019 1138   CREATININE 0.71 12/11/2021 0513   CALCIUM 9.6 12/11/2021 0513  PROT 7.2 12/11/2021 0513   ALBUMIN 4.4 12/11/2021 0513   AST 19 12/11/2021 0513   ALT 28 12/11/2021 0513   ALKPHOS 47 12/11/2021 0513   BILITOT 0.5 12/11/2021 0513   GFRNONAA >60 12/11/2021 0513   GFRAA 105 12/12/2019 1138   No results found for: "CHOL", "HDL", "LDLCALC", "LDLDIRECT", "TRIG", "CHOLHDL" No results found for: "HGBA1C" No results found for: "VITAMINB12" Lab Results  Component Value Date   TSH 2.960 11/15/2019     ASSESSMENT AND PLAN  71 y.o. year old female with history of hypertension, hyperlipidemia and obesity who is presenting with episodes concerning for seizures. Episodes are stereotypical, start with feeling of vertigo and slurred speech followed by generalized tiredness. I will proceed with a routine EEG and if normal, then will order an ambulatory EEG. If both EEGs are normal and patient continues to experience these episodes, then will most likely start her on an antiseizure medication. This was explained to the patient and he is comfortable with plans. Continue to follow up with PCP and return in 6 months or sooner if worse.    1. Transient alteration of awareness   2. Altered sensorium      Patient Instructions  Routine EEG  If normal, then will order ambulatory EEG  MRI Brain with and without contrast  Continue to follow up with  PCP  Return in 6 months or sooner if worse   Orders Placed This Encounter  Procedures   MR BRAIN W WO CONTRAST   EEG adult    No orders of the defined types were placed in this encounter.   Return in about 6 months (around 01/26/2023).    Alric Ran, MD 07/27/2022, 4:31 PM  [facility] [address], [place],  [address] [phone]

## 2022-07-27 NOTE — Telephone Encounter (Signed)
[employer] medicare sent to [facility] they obtain auth [id number] 

## 2022-07-27 NOTE — Patient Instructions (Signed)
Routine EEG  If normal, then will order ambulatory EEG  MRI Brain with and without contrast  Continue to follow up with PCP  Return in 6 months or sooner if worse

## 2022-08-01 ENCOUNTER — Ambulatory Visit: Payer: Medicare HMO | Admitting: Neurology

## 2022-08-01 DIAGNOSIS — R4182 Altered mental status, unspecified: Secondary | ICD-10-CM | POA: Diagnosis not present

## 2022-08-01 DIAGNOSIS — R404 Transient alteration of awareness: Secondary | ICD-10-CM

## 2022-08-01 NOTE — Procedures (Signed)
    History:  71 year old woman with transient alteration of awareness  EEG classification: Awake and drowsy  Description of the recording: The background rhythms of this recording consists of a symmetric beta activity with no clear PDR. Present in the anterior head region is a 15-20 Hz beta activity. Photic stimulation was performed, did not show any abnormalities. Hyperventilation was also performed, did not show any abnormalities. Drowsiness was manifested by background fragmentation. No abnormal epileptiform discharges seen during this recording. There was no focal slowing. There were no electrographic seizure identified. EKG monitor shows no evidence of cardiac rhythm abnormalities with a heart rate of 66.  Abnormality: None   Impression: This is an essentially normal EEG recorded while drowsy and awake. No evidence of interictal epileptiform discharges. Normal EEGs, however, do not rule out epilepsy.    Alric Ran, MD [facility]

## 2022-08-02 NOTE — Progress Notes (Signed)
Routine EEG normal, will proceed with ambulatory  EEG.

## 2022-08-02 NOTE — Addendum Note (Signed)
Addended byAlric Ran on: 08/02/2022 08:56 AM   Modules accepted: Orders

## 2022-08-05 ENCOUNTER — Ambulatory Visit
Admission: RE | Admit: 2022-08-05 | Discharge: 2022-08-05 | Disposition: A | Discharge status: Home / Self Care | Payer: Medicare HMO | Source: Ambulatory Visit | Attending: Neurology | Admitting: Neurology

## 2022-08-05 DIAGNOSIS — R404 Transient alteration of awareness: Secondary | ICD-10-CM

## 2022-08-05 MED ORDER — GADOPICLENOL 0.5 MMOL/ML IV SOLN
9.0000 mL | Freq: Once | INTRAVENOUS | Status: AC | PRN
  Administered 2022-08-05: 9 mL via INTRAVENOUS

## 2022-08-12 ENCOUNTER — Other Ambulatory Visit: Payer: Self-pay | Admitting: Cardiology

## 2022-08-12 DIAGNOSIS — R000 Tachycardia, unspecified: Secondary | ICD-10-CM

## 2022-08-12 DIAGNOSIS — R002 Palpitations: Secondary | ICD-10-CM

## 2022-08-15 ENCOUNTER — Ambulatory Visit: Payer: Medicare HMO | Admitting: Neurology

## 2022-08-15 ENCOUNTER — Other Ambulatory Visit: Payer: Medicare HMO

## 2022-08-22 ENCOUNTER — Telehealth: Payer: Self-pay | Admitting: *Deleted

## 2022-08-22 NOTE — Telephone Encounter (Signed)
Thanks

## 2022-08-22 NOTE — Telephone Encounter (Signed)
Received message from scheduling team @ [facility] stating patient is scheduled for their In-Home Video EEG from 10/21/2021-10/23/2021, per request.

## 2022-08-22 NOTE — Telephone Encounter (Signed)
Noted, thank you

## 2022-08-29 ENCOUNTER — Telehealth: Payer: Self-pay

## 2022-08-29 DIAGNOSIS — R404 Transient alteration of awareness: Secondary | ICD-10-CM

## 2022-08-29 NOTE — Telephone Encounter (Signed)
Yes, ok to send referral

## 2022-08-29 NOTE — Telephone Encounter (Signed)
Pt sent a mychart message today asking for referral to ENT:   She states  I'd like a referral to an ENT concerning ongoing right ear pressure and how it correlates to vertigo/balance episodes.    And also an explanation of mastoid inflammation found during brain MRI.  Thank you

## 2022-08-30 ENCOUNTER — Telehealth: Payer: Self-pay | Admitting: Neurology

## 2022-08-30 NOTE — Telephone Encounter (Signed)
Referral sent to [facility], phone # [phone].

## 2022-08-30 NOTE — Addendum Note (Signed)
Addended by: Lester [place] A on: 08/30/2022 07:20 AM   Modules accepted: Orders

## 2022-08-30 NOTE — Telephone Encounter (Signed)
ENT referral order placed.

## 2022-09-17 ENCOUNTER — Other Ambulatory Visit: Payer: Self-pay | Admitting: Cardiology

## 2022-09-17 ENCOUNTER — Encounter: Payer: Self-pay | Admitting: Neurology

## 2022-09-17 DIAGNOSIS — R000 Tachycardia, unspecified: Secondary | ICD-10-CM

## 2022-09-17 DIAGNOSIS — R002 Palpitations: Secondary | ICD-10-CM

## 2022-09-22 DIAGNOSIS — D3142 Benign neoplasm of left ciliary body: Secondary | ICD-10-CM | POA: Diagnosis not present

## 2022-09-22 DIAGNOSIS — H26491 Other secondary cataract, right eye: Secondary | ICD-10-CM | POA: Diagnosis not present

## 2022-09-22 DIAGNOSIS — H47323 Drusen of optic disc, bilateral: Secondary | ICD-10-CM | POA: Diagnosis not present

## 2022-09-22 DIAGNOSIS — H43813 Vitreous degeneration, bilateral: Secondary | ICD-10-CM | POA: Diagnosis not present

## 2022-10-22 ENCOUNTER — Encounter: Payer: Self-pay | Admitting: Neurology

## 2022-11-17 ENCOUNTER — Other Ambulatory Visit: Payer: Medicare HMO

## 2022-11-17 DIAGNOSIS — U071 COVID-19: Secondary | ICD-10-CM | POA: Diagnosis not present

## 2022-11-24 ENCOUNTER — Other Ambulatory Visit: Payer: Medicare HMO

## 2022-12-15 DIAGNOSIS — R739 Hyperglycemia, unspecified: Secondary | ICD-10-CM | POA: Diagnosis not present

## 2022-12-15 DIAGNOSIS — E559 Vitamin D deficiency, unspecified: Secondary | ICD-10-CM | POA: Diagnosis not present

## 2022-12-15 DIAGNOSIS — I10 Essential (primary) hypertension: Secondary | ICD-10-CM | POA: Diagnosis not present

## 2022-12-15 DIAGNOSIS — F3342 Major depressive disorder, recurrent, in full remission: Secondary | ICD-10-CM | POA: Diagnosis not present

## 2022-12-15 DIAGNOSIS — Z1159 Encounter for screening for other viral diseases: Secondary | ICD-10-CM | POA: Diagnosis not present

## 2022-12-15 DIAGNOSIS — Z6836 Body mass index (BMI) 36.0-36.9, adult: Secondary | ICD-10-CM | POA: Diagnosis not present

## 2022-12-15 DIAGNOSIS — R69 Illness, unspecified: Secondary | ICD-10-CM | POA: Diagnosis not present

## 2022-12-15 DIAGNOSIS — Z0000 Encounter for general adult medical examination without abnormal findings: Secondary | ICD-10-CM | POA: Diagnosis not present

## 2022-12-15 DIAGNOSIS — F411 Generalized anxiety disorder: Secondary | ICD-10-CM | POA: Diagnosis not present

## 2022-12-15 DIAGNOSIS — E049 Nontoxic goiter, unspecified: Secondary | ICD-10-CM | POA: Diagnosis not present

## 2022-12-15 DIAGNOSIS — E785 Hyperlipidemia, unspecified: Secondary | ICD-10-CM | POA: Diagnosis not present

## 2022-12-22 ENCOUNTER — Other Ambulatory Visit: Payer: Medicare HMO

## 2023-01-17 ENCOUNTER — Ambulatory Visit: Payer: Medicare HMO | Admitting: Neurology

## 2023-01-27 DIAGNOSIS — H9313 Tinnitus, bilateral: Secondary | ICD-10-CM | POA: Diagnosis not present

## 2023-01-27 DIAGNOSIS — H903 Sensorineural hearing loss, bilateral: Secondary | ICD-10-CM | POA: Insufficient documentation

## 2023-01-27 DIAGNOSIS — H9193 Unspecified hearing loss, bilateral: Secondary | ICD-10-CM | POA: Diagnosis not present

## 2023-01-27 DIAGNOSIS — H938X1 Other specified disorders of right ear: Secondary | ICD-10-CM | POA: Insufficient documentation

## 2023-01-31 ENCOUNTER — Ambulatory Visit: Payer: Medicare HMO | Admitting: Neurology

## 2023-02-16 DIAGNOSIS — B0052 Herpesviral keratitis: Secondary | ICD-10-CM | POA: Diagnosis not present

## 2023-02-20 DIAGNOSIS — B0052 Herpesviral keratitis: Secondary | ICD-10-CM | POA: Diagnosis not present

## 2023-03-23 DIAGNOSIS — M19031 Primary osteoarthritis, right wrist: Secondary | ICD-10-CM | POA: Diagnosis not present

## 2023-03-23 DIAGNOSIS — M19032 Primary osteoarthritis, left wrist: Secondary | ICD-10-CM | POA: Diagnosis not present

## 2023-03-27 DIAGNOSIS — B0052 Herpesviral keratitis: Secondary | ICD-10-CM | POA: Diagnosis not present

## 2023-03-27 DIAGNOSIS — H16143 Punctate keratitis, bilateral: Secondary | ICD-10-CM | POA: Diagnosis not present

## 2023-04-09 IMAGING — CT CT RENAL STONE PROTOCOL
3 of 4 series · 9 of 46 positions shown, 16 images · non-contrast
Comparison: CT the abdomen and pelvis 02/27/2004.

CLINICAL DATA: 70-year-old female with history of flank pain
radiating into the lower back.



[Series 4: lungs · axial · 0.82mm/px · z∈[-119,-44]mm · 5 of 23 slices shown, 10 images]
[im 4/23  soft-tissue]
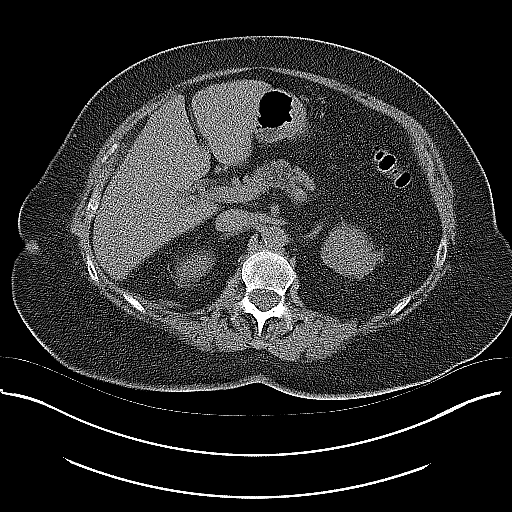
[im 4/23  bone]
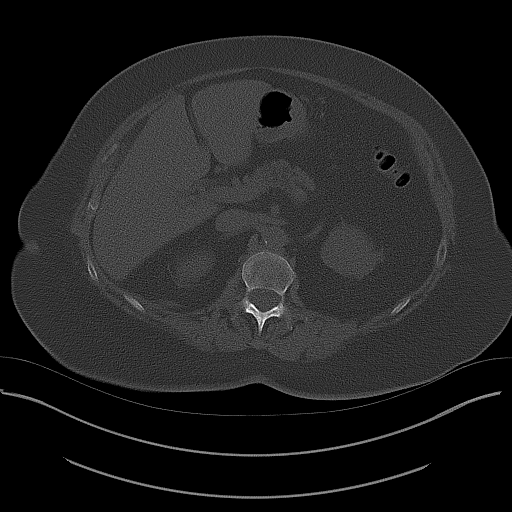
[im 8/23  soft-tissue]
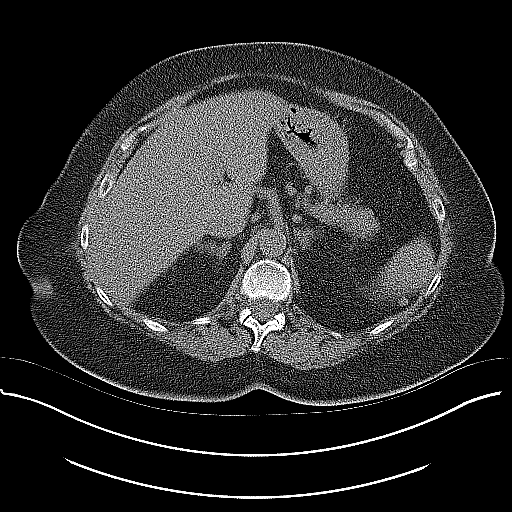
[im 8/23  lung]
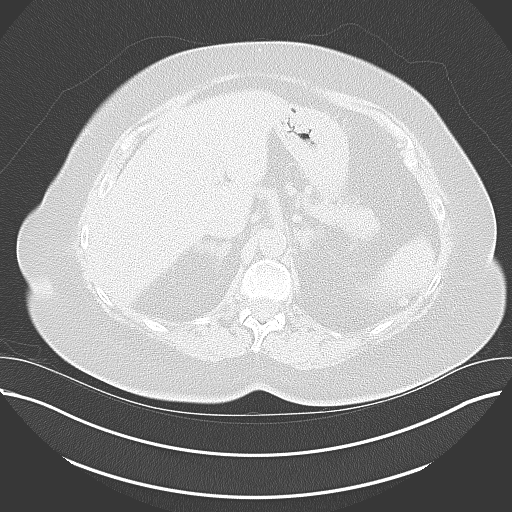
[im 12/23  soft-tissue]
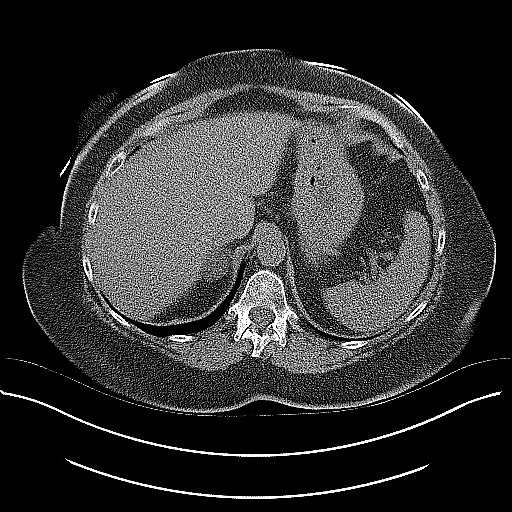
[im 12/23  lung]
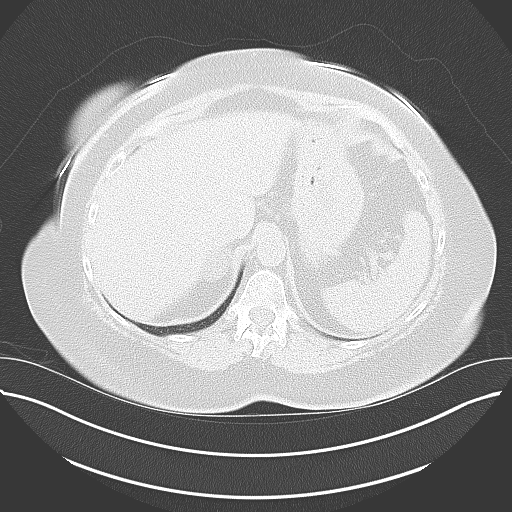
[im 15/23  soft-tissue]
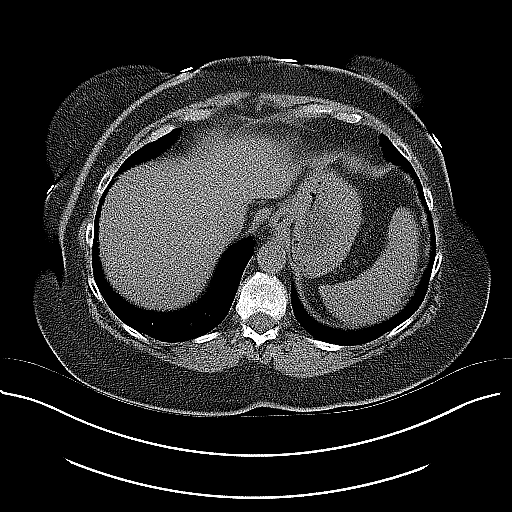
[im 15/23  lung]
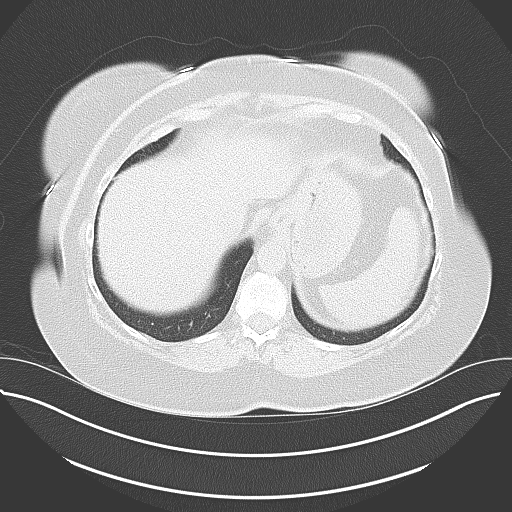
[im 19/23  soft-tissue]
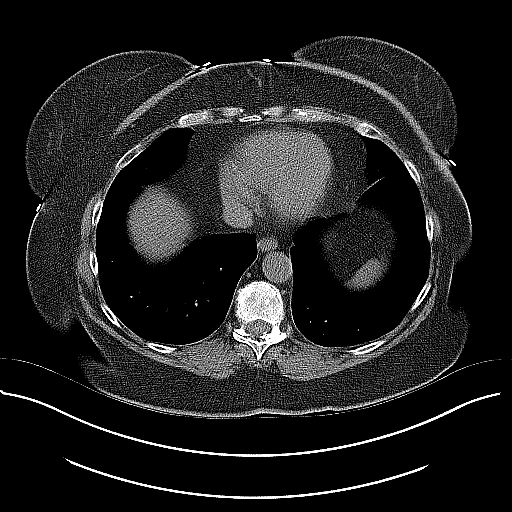
[im 19/23  lung]
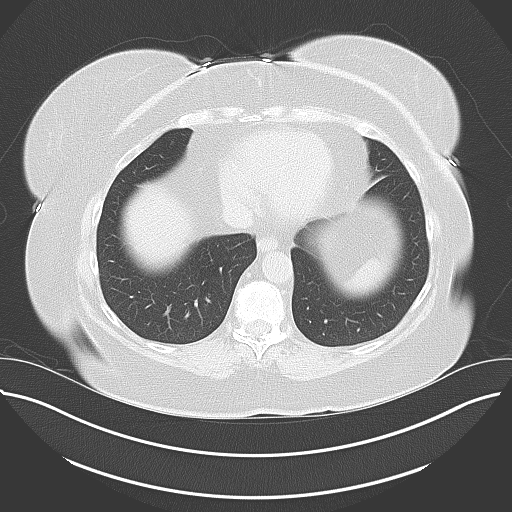

[Series 5: coronal · coronal · 0.77mm/px · 3 of 107 slices shown, 4 images]
[im 36/107  soft-tissue]
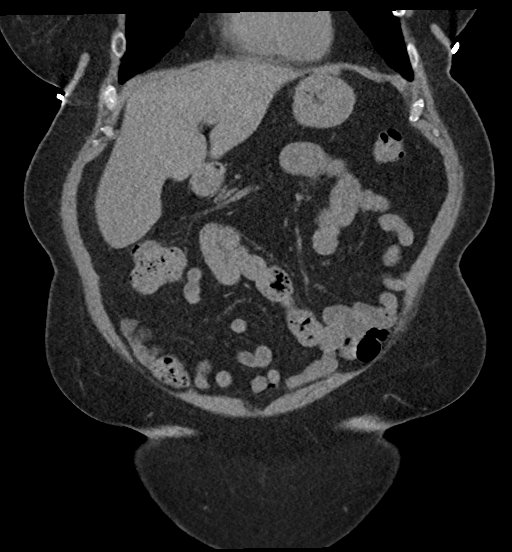
[im 48/107  soft-tissue]
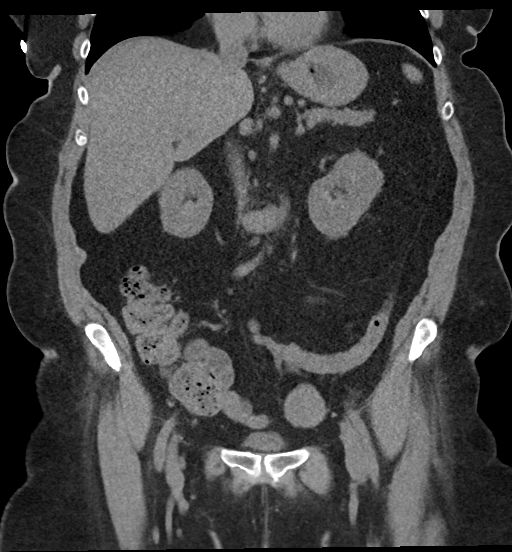
[im 48/107  bone]
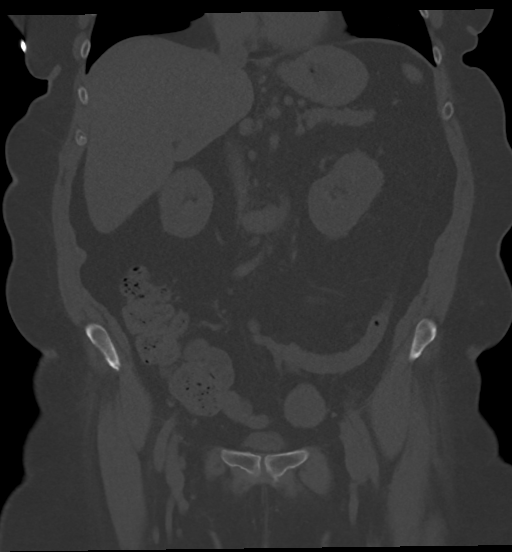
[im 59/107  soft-tissue]
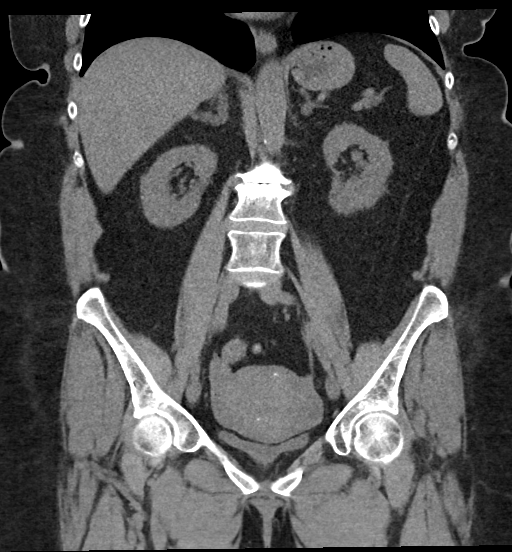

[Series 6: sagittal · sagittal · 0.62mm/px · 1 of 132 slices shown, 2 images]
[im 44/132  soft-tissue]
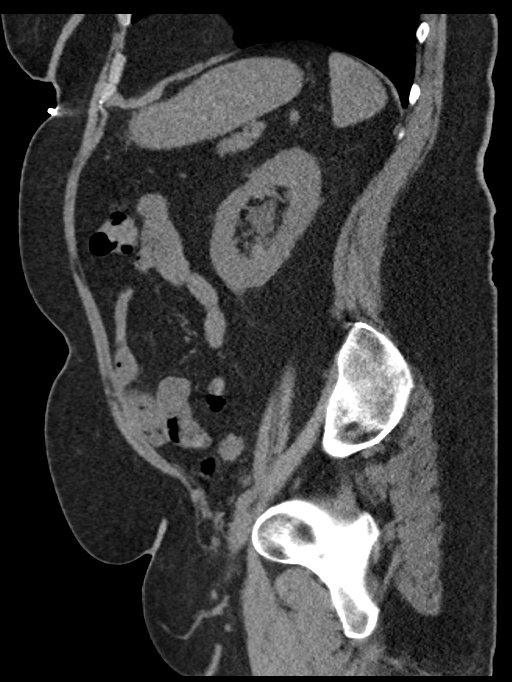
[im 44/132  bone]
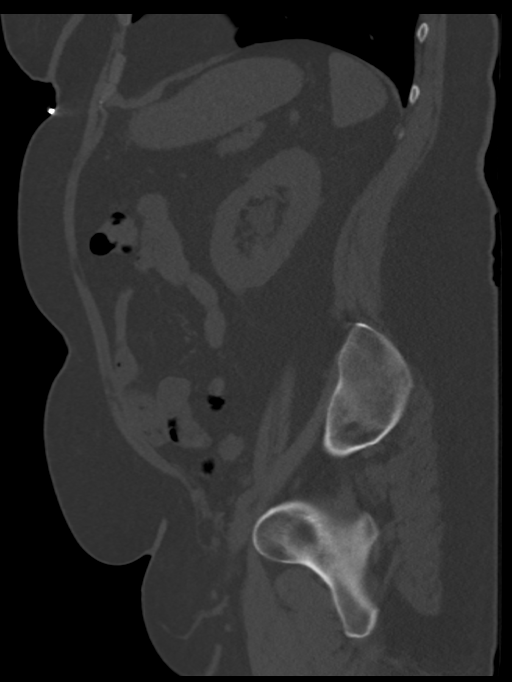

[9 of 46 positions shown; findings below may reference images not displayed]

FINDINGS: Lower chest: Unremarkable.

Hepatobiliary: No suspicious cystic or solid hepatic lesions are
confidently identified on today's noncontrast CT examination. Status
post cholecystectomy.

Pancreas: No definite pancreatic mass or peripancreatic fluid
collections or inflammatory changes are noted on today's noncontrast
CT examination.

Spleen: Unremarkable.

Adrenals/Urinary Tract: There are no abnormal calcifications within
the collecting system of either kidney, along the course of either
ureter, or within the lumen of the urinary bladder. No
hydroureteronephrosis or perinephric stranding to suggest urinary
tract obstruction at this time. The unenhanced appearance of the
kidneys is unremarkable bilaterally. Unenhanced appearance of the
urinary bladder is normal. Left adrenal gland is normal in
appearance. 3.4 x 2.4 cm right adrenal mass is intermediate
attenuation (21 HU), and new compared to the prior examination.

Stomach/Bowel: Unenhanced appearance of the stomach is normal. There
is no pathologic dilatation of small bowel or colon. Numerous
colonic diverticulae are noted, without surrounding inflammatory
changes to suggest an acute diverticulitis at this time. Normal
appendix.

Vascular/Lymphatic: Aortic atherosclerosis. No lymphadenopathy noted
in the abdomen or pelvis.

Reproductive: Uterus is mildly enlarged and heterogeneous in
appearance with multiple internal lesions, several of which are
partially calcified, presumably multifocal fibroids. The largest
lesion is slightly exophytic in the left side of the uterine fundus
measuring up to 3.7 cm in diameter. Ovaries are atrophic.

Other: No significant volume of ascites.  No pneumoperitoneum.

Musculoskeletal: There are no aggressive appearing lytic or blastic
lesions noted in the visualized portions of the skeleton.
IMPRESSION: 1. No acute findings are noted in the abdomen or pelvis to account
for the patient's symptoms.
2. Colonic diverticulosis without evidence of acute diverticulitis
at this time.
3. Right adrenal mass, new compared to remote prior study from 8995
with indeterminate imaging features. Follow-up nonemergent
outpatient adrenal protocol CT scan is recommended for further
characterization.
4. Fibroid uterus again noted.
5. Aortic atherosclerosis.

## 2023-05-20 IMAGING — CT CT ABDOMEN WO/W CM
2 of 5 series · 13 of 32 positions shown, 18 images · IV contrast (APPLIED)
Comparison: CT stone study 12/11/2021

CLINICAL DATA: Adrenal mass.

EXAM:
CT ABDOMEN WITHOUT AND WITH CONTRAST
TECHNIQUE: Multidetector CT imaging of the abdomen was performed following the
standard protocol before and following the bolus administration of
intravenous contrast.

[Series 2: adrenal w/(date) · axial · 0.89mm/px · z∈[-291,-101]mm · 7 of 127 slices shown, 12 images]
[im 16/127  soft-tissue]
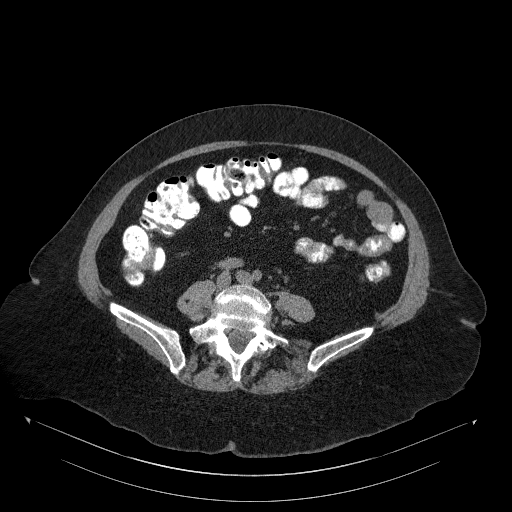
[im 16/127  bone]
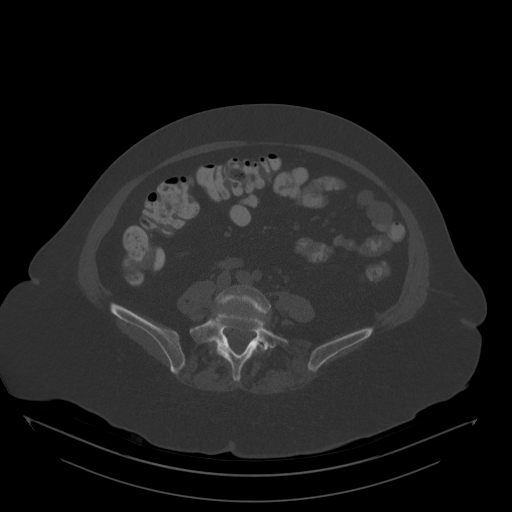
[im 32/127  soft-tissue]
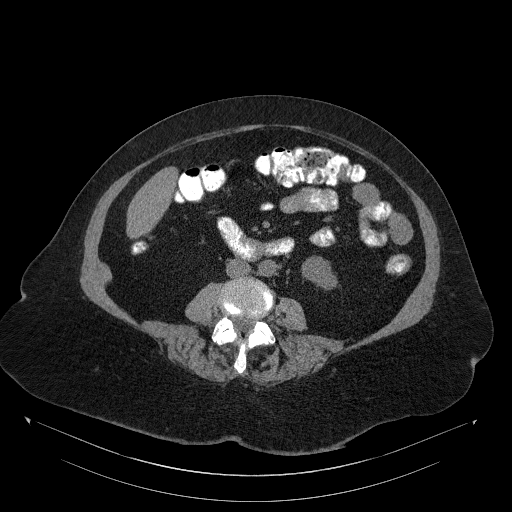
[im 48/127  soft-tissue]
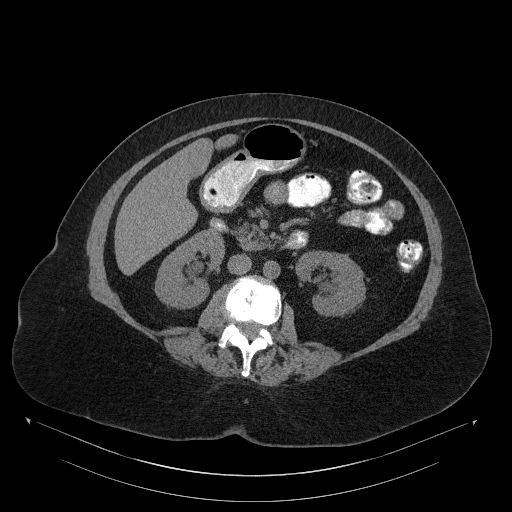
[im 64/127  soft-tissue]
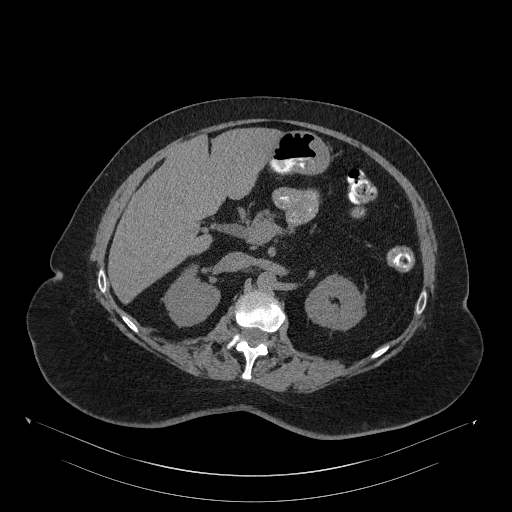
[im 64/127  lung]
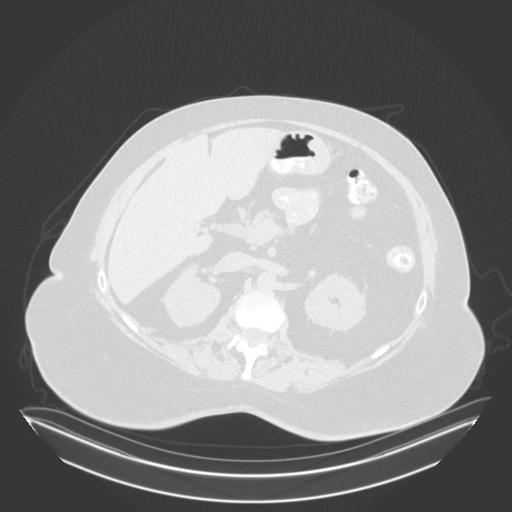
[im 79/127  soft-tissue]
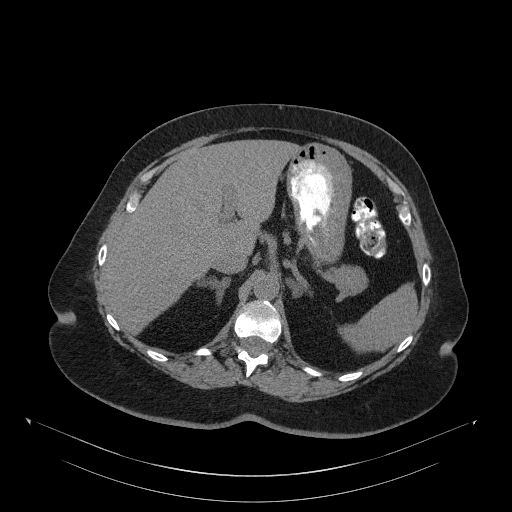
[im 79/127  lung]
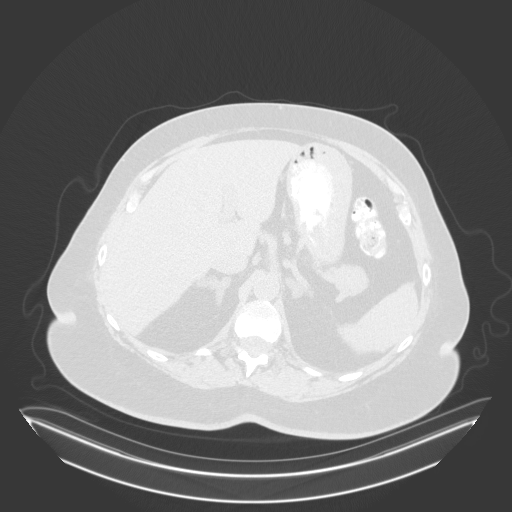
[im 95/127  soft-tissue]
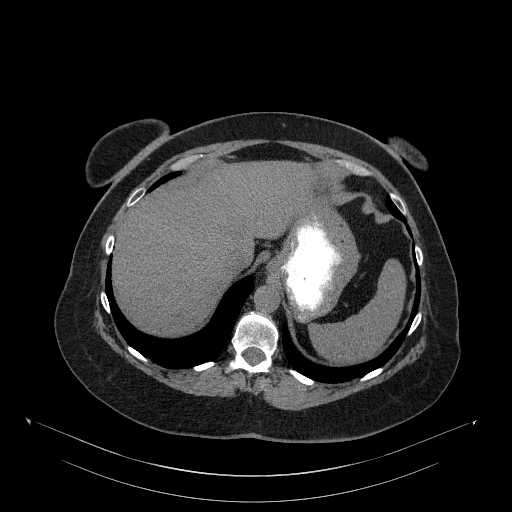
[im 95/127  lung]
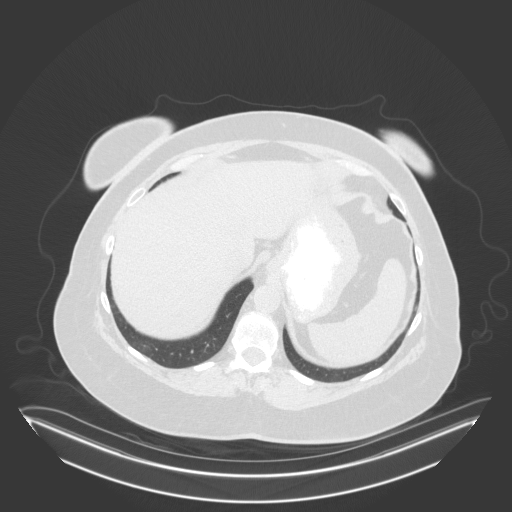
[im 111/127  soft-tissue]
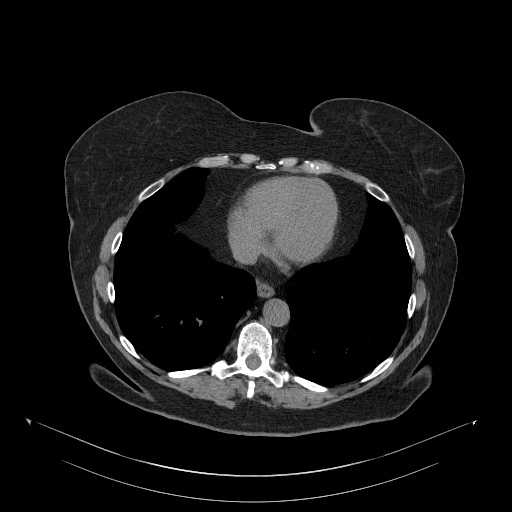
[im 111/127  lung]
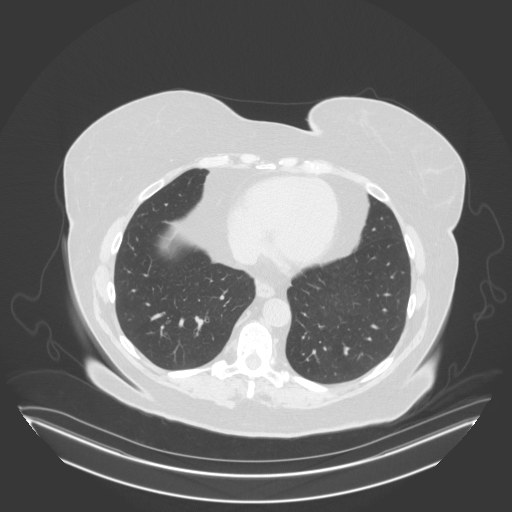

[Series 4: adrenal w/cm · axial · 0.89mm/px · z∈[-285,-105]mm · 6 of 127 slices shown]
[im 19/127  soft-tissue]
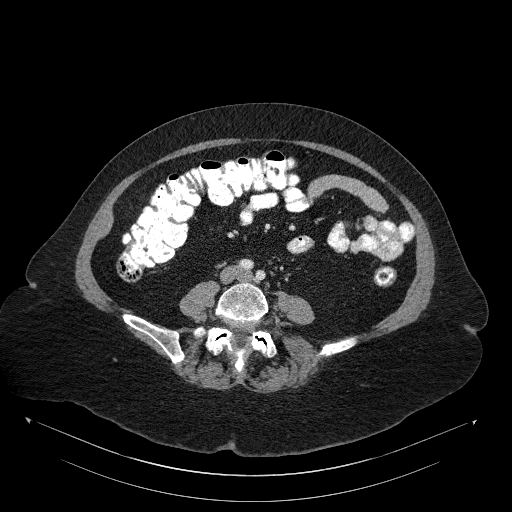
[im 37/127  soft-tissue]
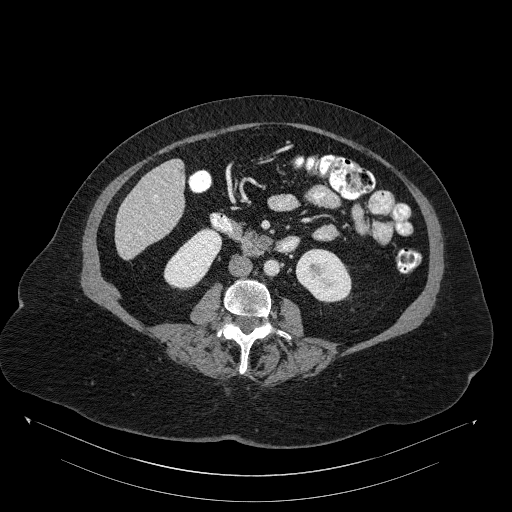
[im 55/127  soft-tissue]
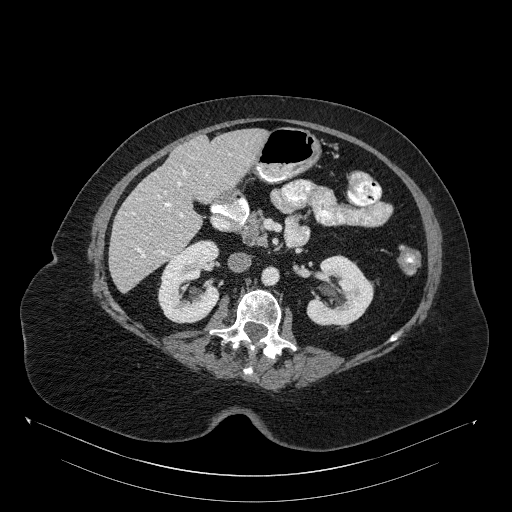
[im 73/127  soft-tissue]
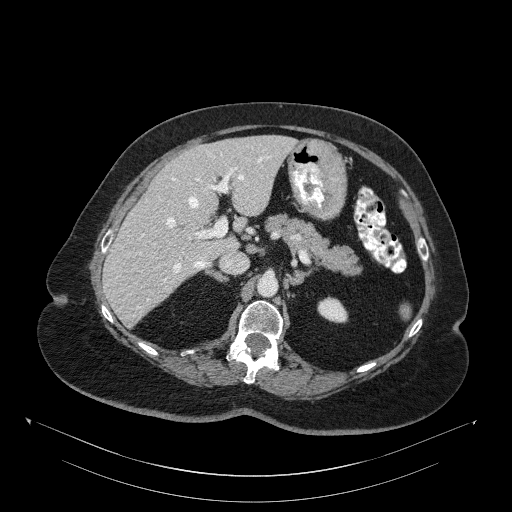
[im 91/127  soft-tissue]
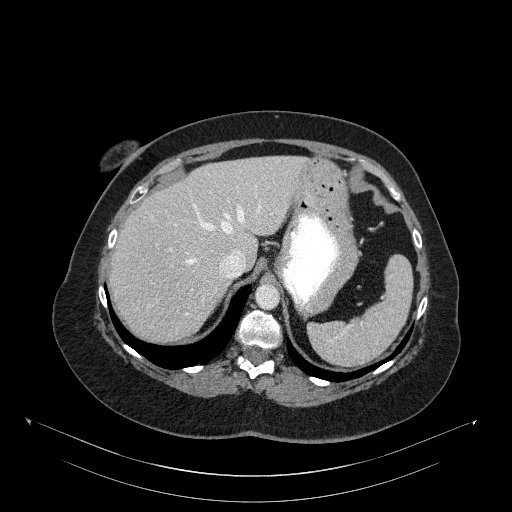
[im 109/127  soft-tissue]
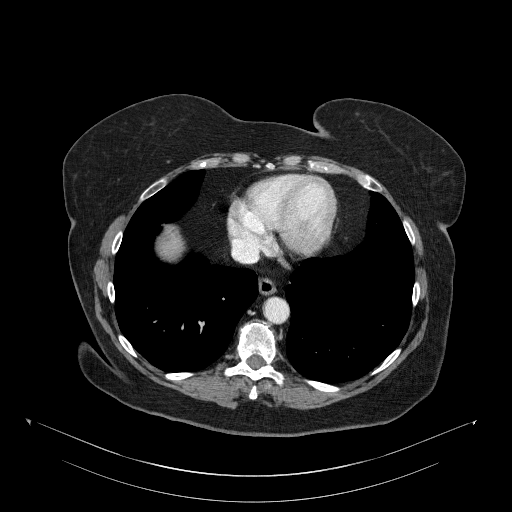

[13 of 32 positions shown; findings below may reference images not displayed]

RADIATION DOSE REDUCTION: This exam was performed according to the
departmental dose-optimization program which includes automated
exposure control, adjustment of the mA and/or kV according to
patient size and/or use of iterative reconstruction technique.

CONTRAST:  100mL ILFGBU-D66 IOPAMIDOL (ILFGBU-D66) INJECTION 61%
FINDINGS: Lower chest: Unremarkable.

Hepatobiliary: No suspicious focal abnormality in the liver on this
study without intravenous contrast. Gallbladder surgically absent.
No intrahepatic or extrahepatic biliary dilation.

Pancreas: No focal mass lesion. No dilatation of the main duct. No
intraparenchymal cyst. No peripancreatic edema.

Spleen: No splenomegaly. No focal mass lesion.

Adrenals/Urinary Tract: 3.4 cm right adrenal nodule has an average
attenuation of 10 Hounsfield units on precontrast imaging consistent
with adenoma. The portal venous phase imaging demonstrates average
attenuation of 51 Hounsfield units with delayed imaging
demonstrating average attenuation of 38 Hounsfield units. Left
adrenal gland unremarkable. Kidneys unremarkable.

Stomach/Bowel: Stomach is unremarkable. No gastric wall thickening.
No evidence of outlet obstruction. Duodenum is normally positioned
as is the ligament of Treitz. No small bowel or colonic dilatation
within the visualized abdomen.

Vascular/Lymphatic: No abdominal aortic aneurysm. No abdominal
lymphadenopathy.

Other: No intraperitoneal free fluid.

Musculoskeletal: No worrisome lytic or sclerotic osseous
abnormality.
IMPRESSION: Stable appearance of a 3.4 cm right adrenal nodule. This lesion has
indeterminate washout characteristics on postcontrast imaging with
pre contrast imaging borderline for lipid poor adenoma. Given the
size of the lesion and indeterminate washout characteristics,
follow-up MRI in 3-6 months recommended to ensure stability.

## 2023-05-25 ENCOUNTER — Other Ambulatory Visit: Payer: Self-pay | Admitting: Family Medicine

## 2023-05-25 ENCOUNTER — Ambulatory Visit
Admission: RE | Admit: 2023-05-25 | Discharge: 2023-05-25 | Disposition: A | Discharge status: Home / Self Care | Payer: Medicare HMO | Source: Ambulatory Visit | Attending: Family Medicine | Admitting: Family Medicine

## 2023-05-25 DIAGNOSIS — E2839 Other primary ovarian failure: Secondary | ICD-10-CM

## 2023-05-25 DIAGNOSIS — N958 Other specified menopausal and perimenopausal disorders: Secondary | ICD-10-CM | POA: Diagnosis not present

## 2023-05-25 DIAGNOSIS — M8588 Other specified disorders of bone density and structure, other site: Secondary | ICD-10-CM | POA: Diagnosis not present

## 2023-05-25 DIAGNOSIS — E349 Endocrine disorder, unspecified: Secondary | ICD-10-CM | POA: Diagnosis not present

## 2023-06-15 DIAGNOSIS — Z23 Encounter for immunization: Secondary | ICD-10-CM | POA: Diagnosis not present

## 2023-06-15 DIAGNOSIS — R0989 Other specified symptoms and signs involving the circulatory and respiratory systems: Secondary | ICD-10-CM | POA: Diagnosis not present

## 2023-06-15 DIAGNOSIS — E559 Vitamin D deficiency, unspecified: Secondary | ICD-10-CM | POA: Diagnosis not present

## 2023-06-15 DIAGNOSIS — G8929 Other chronic pain: Secondary | ICD-10-CM | POA: Diagnosis not present

## 2023-06-15 DIAGNOSIS — I10 Essential (primary) hypertension: Secondary | ICD-10-CM | POA: Diagnosis not present

## 2023-06-15 DIAGNOSIS — F411 Generalized anxiety disorder: Secondary | ICD-10-CM | POA: Diagnosis not present

## 2023-06-15 DIAGNOSIS — J45909 Unspecified asthma, uncomplicated: Secondary | ICD-10-CM | POA: Diagnosis not present

## 2023-06-15 DIAGNOSIS — H6991 Unspecified Eustachian tube disorder, right ear: Secondary | ICD-10-CM | POA: Diagnosis not present

## 2023-06-29 DIAGNOSIS — M1811 Unilateral primary osteoarthritis of first carpometacarpal joint, right hand: Secondary | ICD-10-CM | POA: Diagnosis not present

## 2023-06-29 DIAGNOSIS — M1812 Unilateral primary osteoarthritis of first carpometacarpal joint, left hand: Secondary | ICD-10-CM | POA: Diagnosis not present

## 2023-07-20 ENCOUNTER — Other Ambulatory Visit: Payer: Self-pay | Admitting: Family Medicine

## 2023-07-20 DIAGNOSIS — Z0000 Encounter for general adult medical examination without abnormal findings: Secondary | ICD-10-CM

## 2023-08-01 ENCOUNTER — Ambulatory Visit: Payer: Medicare HMO

## 2023-08-17 ENCOUNTER — Ambulatory Visit
Admission: RE | Admit: 2023-08-17 | Discharge: 2023-08-17 | Disposition: A | Discharge status: Home / Self Care | Payer: Medicare HMO | Source: Ambulatory Visit | Attending: Family Medicine | Admitting: Family Medicine

## 2023-08-17 DIAGNOSIS — Z0000 Encounter for general adult medical examination without abnormal findings: Secondary | ICD-10-CM

## 2023-08-17 DIAGNOSIS — Z1231 Encounter for screening mammogram for malignant neoplasm of breast: Secondary | ICD-10-CM | POA: Diagnosis not present

## 2023-09-18 DIAGNOSIS — H16223 Keratoconjunctivitis sicca, not specified as Sjogren's, bilateral: Secondary | ICD-10-CM | POA: Diagnosis not present

## 2023-10-23 DIAGNOSIS — H26493 Other secondary cataract, bilateral: Secondary | ICD-10-CM | POA: Diagnosis not present

## 2023-10-23 DIAGNOSIS — H26492 Other secondary cataract, left eye: Secondary | ICD-10-CM | POA: Diagnosis not present

## 2023-10-23 DIAGNOSIS — Z961 Presence of intraocular lens: Secondary | ICD-10-CM | POA: Diagnosis not present

## 2023-10-23 DIAGNOSIS — I10 Essential (primary) hypertension: Secondary | ICD-10-CM | POA: Diagnosis not present

## 2023-11-07 DIAGNOSIS — M1811 Unilateral primary osteoarthritis of first carpometacarpal joint, right hand: Secondary | ICD-10-CM | POA: Diagnosis not present

## 2023-11-07 DIAGNOSIS — M1812 Unilateral primary osteoarthritis of first carpometacarpal joint, left hand: Secondary | ICD-10-CM | POA: Diagnosis not present

## 2023-11-13 DIAGNOSIS — H26491 Other secondary cataract, right eye: Secondary | ICD-10-CM | POA: Diagnosis not present

## 2024-02-20 DIAGNOSIS — M25532 Pain in left wrist: Secondary | ICD-10-CM | POA: Diagnosis not present

## 2024-02-20 DIAGNOSIS — M25562 Pain in left knee: Secondary | ICD-10-CM | POA: Diagnosis not present

## 2024-02-20 DIAGNOSIS — M25531 Pain in right wrist: Secondary | ICD-10-CM | POA: Diagnosis not present

## 2024-03-06 ENCOUNTER — Institutional Professional Consult (permissible substitution) (INDEPENDENT_AMBULATORY_CARE_PROVIDER_SITE_OTHER)

## 2024-06-18 DIAGNOSIS — M1712 Unilateral primary osteoarthritis, left knee: Secondary | ICD-10-CM | POA: Diagnosis not present

## 2024-06-18 DIAGNOSIS — M170 Bilateral primary osteoarthritis of knee: Secondary | ICD-10-CM | POA: Diagnosis not present

## 2024-06-18 DIAGNOSIS — M1711 Unilateral primary osteoarthritis, right knee: Secondary | ICD-10-CM | POA: Diagnosis not present

## 2024-07-02 DIAGNOSIS — H52223 Regular astigmatism, bilateral: Secondary | ICD-10-CM | POA: Diagnosis not present

## 2024-07-02 DIAGNOSIS — D3142 Benign neoplasm of left ciliary body: Secondary | ICD-10-CM | POA: Diagnosis not present

## 2024-07-02 DIAGNOSIS — H318 Other specified disorders of choroid: Secondary | ICD-10-CM | POA: Diagnosis not present

## 2024-07-02 DIAGNOSIS — H53142 Visual discomfort, left eye: Secondary | ICD-10-CM | POA: Diagnosis not present

## 2024-07-02 DIAGNOSIS — H47323 Drusen of optic disc, bilateral: Secondary | ICD-10-CM | POA: Diagnosis not present

## 2024-07-02 DIAGNOSIS — Z961 Presence of intraocular lens: Secondary | ICD-10-CM | POA: Diagnosis not present

## 2024-07-02 DIAGNOSIS — H524 Presbyopia: Secondary | ICD-10-CM | POA: Diagnosis not present

## 2024-07-11 DIAGNOSIS — S62616A Displaced fracture of proximal phalanx of right little finger, initial encounter for closed fracture: Secondary | ICD-10-CM | POA: Diagnosis not present

## 2024-07-11 DIAGNOSIS — M25562 Pain in left knee: Secondary | ICD-10-CM | POA: Diagnosis not present

## 2024-07-16 DIAGNOSIS — S62646A Nondisplaced fracture of proximal phalanx of right little finger, initial encounter for closed fracture: Secondary | ICD-10-CM | POA: Diagnosis not present

## 2024-07-17 DIAGNOSIS — H47323 Drusen of optic disc, bilateral: Secondary | ICD-10-CM | POA: Diagnosis not present

## 2024-07-17 DIAGNOSIS — H5347 Heteronymous bilateral field defects: Secondary | ICD-10-CM | POA: Diagnosis not present

## 2024-07-17 DIAGNOSIS — Z961 Presence of intraocular lens: Secondary | ICD-10-CM | POA: Diagnosis not present

## 2024-07-25 DIAGNOSIS — S62616D Displaced fracture of proximal phalanx of right little finger, subsequent encounter for fracture with routine healing: Secondary | ICD-10-CM | POA: Diagnosis not present

## 2024-07-25 DIAGNOSIS — S62618A Displaced fracture of proximal phalanx of other finger, initial encounter for closed fracture: Secondary | ICD-10-CM | POA: Insufficient documentation

## 2024-08-26 DIAGNOSIS — G9389 Other specified disorders of brain: Secondary | ICD-10-CM | POA: Diagnosis not present

## 2024-08-28 DIAGNOSIS — F3342 Major depressive disorder, recurrent, in full remission: Secondary | ICD-10-CM | POA: Insufficient documentation

## 2024-08-28 DIAGNOSIS — K5730 Diverticulosis of large intestine without perforation or abscess without bleeding: Secondary | ICD-10-CM | POA: Insufficient documentation

## 2024-08-28 DIAGNOSIS — E559 Vitamin D deficiency, unspecified: Secondary | ICD-10-CM | POA: Insufficient documentation

## 2024-08-28 DIAGNOSIS — E049 Nontoxic goiter, unspecified: Secondary | ICD-10-CM | POA: Insufficient documentation

## 2024-08-28 DIAGNOSIS — G8929 Other chronic pain: Secondary | ICD-10-CM | POA: Insufficient documentation

## 2024-08-28 DIAGNOSIS — E278 Other specified disorders of adrenal gland: Secondary | ICD-10-CM | POA: Insufficient documentation

## 2024-08-28 DIAGNOSIS — J45909 Unspecified asthma, uncomplicated: Secondary | ICD-10-CM | POA: Insufficient documentation

## 2024-08-28 DIAGNOSIS — E785 Hyperlipidemia, unspecified: Secondary | ICD-10-CM | POA: Insufficient documentation

## 2024-08-28 DIAGNOSIS — J309 Allergic rhinitis, unspecified: Secondary | ICD-10-CM | POA: Insufficient documentation

## 2024-08-28 DIAGNOSIS — G4700 Insomnia, unspecified: Secondary | ICD-10-CM | POA: Insufficient documentation

## 2024-08-28 DIAGNOSIS — G2581 Restless legs syndrome: Secondary | ICD-10-CM | POA: Insufficient documentation

## 2024-08-28 DIAGNOSIS — I10 Essential (primary) hypertension: Secondary | ICD-10-CM | POA: Insufficient documentation

## 2024-08-28 DIAGNOSIS — K219 Gastro-esophageal reflux disease without esophagitis: Secondary | ICD-10-CM | POA: Insufficient documentation

## 2024-08-28 DIAGNOSIS — R739 Hyperglycemia, unspecified: Secondary | ICD-10-CM | POA: Insufficient documentation

## 2024-08-29 ENCOUNTER — Inpatient Hospital Stay
Admission: RE | Admit: 2024-08-29 | Discharge: 2024-08-29 | Disposition: A | Payer: Self-pay | Source: Ambulatory Visit | Attending: Neurosurgery | Admitting: Neurosurgery

## 2024-08-29 ENCOUNTER — Other Ambulatory Visit: Payer: Self-pay

## 2024-08-29 DIAGNOSIS — H47323 Drusen of optic disc, bilateral: Secondary | ICD-10-CM | POA: Diagnosis not present

## 2024-08-29 DIAGNOSIS — Z049 Encounter for examination and observation for unspecified reason: Secondary | ICD-10-CM

## 2024-08-29 DIAGNOSIS — Z961 Presence of intraocular lens: Secondary | ICD-10-CM | POA: Diagnosis not present

## 2024-08-29 DIAGNOSIS — H5347 Heteronymous bilateral field defects: Secondary | ICD-10-CM | POA: Diagnosis not present

## 2024-08-30 ENCOUNTER — Encounter: Payer: Self-pay | Admitting: Neurosurgery

## 2024-08-30 ENCOUNTER — Ambulatory Visit: Admitting: Neurosurgery

## 2024-08-30 VITALS — BP 138/86 | HR 78 | Temp 97.5°F | Ht 64.0 in | Wt 216.0 lb

## 2024-08-30 DIAGNOSIS — H538 Other visual disturbances: Secondary | ICD-10-CM

## 2024-08-30 DIAGNOSIS — D329 Benign neoplasm of meninges, unspecified: Secondary | ICD-10-CM

## 2024-08-30 NOTE — Progress Notes (Signed)
 Assessment : Discussed the use of AI scribe software for clinical note transcription with the patient, who gave verbal consent to proceed.  History of Present Illness Emily Cruz is a 73 year old female who presents with visual disturbances in her left eye. She was referred by Doctor Eyvonne for evaluation of pressure on the optic nerve.  She has been experiencing visual disturbances in her left eye for approximately a year and a half, initially noticing the issue while driving. The vision is described as being like 'a frosted glass,' with difficulty seeing tree leaves clearly. Initially, she attributed this to her cataract surgery but later realized it was unrelated to the lens.  The visual disturbance has remained relatively stable over time, with no significant worsening. She describes the vision as distorted, akin to a microscope needing adjustment, and notes a specific quadrant, possibly the lower left, that is affected. There is no associated pain or headaches, and she has only experienced two headaches in her entire life.  The patient recalls that Doctor Eyvonne said the MRI showed pressure on the optic nerve, and visual field tests indicated a possible deficit in the lower left quadrant of her vision. Multiple eye doctors have confirmed that the issue is not related to her cataract surgery.  Her daily life, particularly driving, is affected as she requires glasses to compensate for the vision loss in her left eye. She has adapted by using glasses with a plus one prescription for driving, as her cataract surgery had designated one eye for reading and the other for distance. She retired in May and is currently traveling and driving.    Plan : I am really surprised to see that she has already lost vision temporally in her left eye and it is to a lesser degree also lost on the right.  On my exam today oddly she was able to see the temporal side though she does say that it is blurry.  I went  over the options with her: First option would be is to do nothing and get serial imaging.  I told her that I would not advise this given the fact that she is already 73 and serial imaging is only going to demonstrate further growth over time and if not, the visual loss is significant enough do not recommend observation for now.  Second option would be is to pursue radiosurgery.  The benefit of this would be that this is noninvasive however my concern here is is that the optic nerves could become collateral damage, in other words with radiation you would stem the growth of this tumor however the optic nerves being the most sensitive tissue in your brain for radiation, she could lose vision which would defeat the whole purpose.  Last option would be surgical resection either through a transsphenoidal route or through a craniotomy.  We went over these risks and benefits in great detail I told her that I would like to get a dedicated MRI of the brain with and without contrast as well as a CT of the sinuses and I will see her back thereafter.  She pledged to write down her questions and bring family along so that we can have a conversation about this.  I will see her back after the MRI of the CT.   Social History   Socioeconomic History   Marital status: Single    Spouse name: Not on file   Number of children: 0   Years of education: Not on  file   Highest education level: Not on file  Occupational History   Not on file  Tobacco Use   Smoking status: Never   Smokeless tobacco: Never  Vaping Use   Vaping status: Never Used  Substance and Sexual Activity   Alcohol use: Never   Drug use: Never   Sexual activity: Not on file  Other Topics Concern   Not on file  Social History Narrative   Not on file   Social Drivers of Health   Financial Resource Strain: Not on file  Food Insecurity: Low Risk  (01/27/2023)   Received from [facility]   Hunger Vital Sign    Within the past 12 months,  you worried that your food would run out before you got money to buy more: Never true    Within the past 12 months, the food you bought just didn't last and you didn't have money to get more. 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     Nose: Nose normal.  Eyes:     Pupils: Pupils are equal, round, and reactive to light.  Cardiovascular:     Rate and Rhythm: Normal rate.  Pulmonary:     Effort: Pulmonary effort is normal.  Abdominal:     General: Abdomen is flat.  Musculoskeletal:     Cervical back: Normal range of motion.  Neurological:     Mental Status: She is alert.     Cranial Nerves: Cranial nerves 2-12 are intact.     Sensory: Sensation is intact.     Motor: Motor function is intact.     Coordination: Coordination is intact.     Results for orders placed or performed during the hospital encounter of 08/05/22  MR BRAIN W WO CONTRAST   Narrative     [facility] [address], [address] [place], [place] [address] [phone]  NEUROIMAGING REPORT   STUDY DATE: 08/05/2022 PATIENT NAME: Emily Cruz DOB: 08-26-1951 MRN:  [id number]  ORDERING CLINICIAN: Dr. Gregg CLINICAL HISTORY: 73 year old patient being evaluated for episode of  transient altered consciousness COMPARISON FILMS: None available EXAM: MRI brain with and without contrast TECHNIQUE: Sagittal T1, axial T1, T2, FLAIR, DWI, ADC map, SWI, coronal  T2, thin axial and coronal images through medial temporal lobes and  postcontrast axial and coronal T1 images were obtained CONTRAST: 9 mL IV Vueway  IMAGING SITE: [facility]  FINDINGS:  The brain parenchyma shows mild changes of chronic small vessel disease  and generalized age-appropriate cerebral atrophy.  No other structural  lesion, tumor or infarcts are noted.  Diffusion-weighted imaging is  negative for ischemia.  SWI images do not show microhemorrhages.   Subarachnoid spaces and ventricular system appear normal.  Cortical sulci  and gyri show normal appearance.  Extra-axial brain structures appear  normal.  Orbits appear unremarkable.  Paranasal sinuses appear normal  except there is evidence of chronic inflammatory changes in the mastoids  on the right.  The pituitary gland is atrophied with enlarged sella.   Visualized portion of the upper cervical spine show mild degenerative  changes.  Flow-voids of large vessels of intracranial circulation appear  to be patent.      Impression   Unremarkable MRI scan of the brain with and without contrast  showing only mild age-related changes of chronic small vessel disease and  generalized atrophy.    INTERPRETING PHYSICIAN:  EATHER POPP, MD Certified in  Neuroimaging by American Society of Neuroimaging and [employer]

## 2024-09-02 ENCOUNTER — Other Ambulatory Visit: Payer: Self-pay | Admitting: Neurosurgery

## 2024-09-09 ENCOUNTER — Encounter: Payer: Self-pay | Admitting: Neurosurgery

## 2024-09-16 ENCOUNTER — Ambulatory Visit
Admission: RE | Admit: 2024-09-16 | Discharge: 2024-09-16 | Disposition: A | Source: Ambulatory Visit | Attending: Neurosurgery | Admitting: Neurosurgery

## 2024-09-16 DIAGNOSIS — D329 Benign neoplasm of meninges, unspecified: Secondary | ICD-10-CM

## 2024-09-16 DIAGNOSIS — D320 Benign neoplasm of cerebral meninges: Secondary | ICD-10-CM | POA: Diagnosis not present

## 2024-09-16 MED ORDER — GADOPICLENOL 0.5 MMOL/ML IV SOLN
10.0000 mL | Freq: Once | INTRAVENOUS | Status: AC | PRN
  Administered 2024-09-16: 10 mL via INTRAVENOUS

## 2024-09-20 ENCOUNTER — Ambulatory Visit: Admitting: Neurosurgery

## 2024-09-20 ENCOUNTER — Encounter: Payer: Self-pay | Admitting: Neurosurgery

## 2024-09-20 VITALS — BP 154/91 | HR 68 | Temp 97.8°F | Ht 64.0 in | Wt 218.2 lb

## 2024-09-20 DIAGNOSIS — D329 Benign neoplasm of meninges, unspecified: Secondary | ICD-10-CM

## 2024-09-20 DIAGNOSIS — D320 Benign neoplasm of cerebral meninges: Secondary | ICD-10-CM | POA: Diagnosis not present

## 2024-09-20 NOTE — Progress Notes (Signed)
 73 year old lady with a progressive visual loss in her left eye greater than the right eye.  I saw her last week and requested a dedicated set of imaging and an MRI and CT.  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  There is an expanded sella turcica with flattening of the pituitary gland, a so-called empty sella.  I reviewed the images with her, her best friend monte is a retired chief financial officer ICU nurse], and the daughter.  I explained to them a few issues: I think that observation is not going to be very helpful because she has already lost vision in her left eye and I think that this will continue to get worse.  As I had told her the last time, radiosurgery is going to be detrimental to her vision and I do not recommend that.  She wants to proceed with surgery and I talked to her about the endovascular route versus a craniotomy.  We talked about the benefits and risks of each.  I told her that after considering my options, I do not recommend a transsphenoidal route for the following reasons: She has an expanded sella turcica and flattening of the pituitary gland which indicates chronic elevated intracranial pressures.  This is often seen in much younger women with obesity in the setting of idiopathic intracranial hypertension.  If this is the case, then a removal of the tumor through a transsphenoidal route will render her at risk for a CSF leak if she indeed has elevated intracranial pressures.  In that instance, repeated surgeries would be needed to repair a CSF leak and she may end up getting a ventriculoperitoneal shunt.  To avoid that scenario, I recommend a left-sided craniotomy for resection.  I went over the risks and benefits of the surgery and told her about the risk of seizure, stroke, paralysis but also that of blindness.  I told her that I was going to remove the tumor from the left optic canal but he left optic nerve that has been  under pressure for a long period of time and sometimes gets worse after dissection.  We talked about the postoperative course as well.  She opted to have the surgery done in the beginning of January and wants to proceed with that.  From my standpoint she can continue on aspirin.  She voiced understanding to go to the emergency room immediately if her vision gets worse.

## 2024-09-21 DIAGNOSIS — M1711 Unilateral primary osteoarthritis, right knee: Secondary | ICD-10-CM | POA: Diagnosis not present

## 2024-10-21 ENCOUNTER — Other Ambulatory Visit: Payer: Self-pay

## 2024-10-21 DIAGNOSIS — D329 Benign neoplasm of meninges, unspecified: Secondary | ICD-10-CM

## 2024-10-21 NOTE — Pre-Procedure Instructions (Signed)
 Surgical Instructions   Your procedure is scheduled on October 28, 2024. Report to [facility] Main Entrance A at 5:30 A.M., then check in with the Admitting office. Any questions or running late day of surgery: call [phone]  Questions prior to your surgery date: call [phone], Monday-Friday, 8am-4pm. If you experience any cold or flu symptoms such as cough, fever, chills, shortness of breath, etc. between now and your scheduled surgery, please notify us  at the above number.     Remember:  Do not eat or drink after midnight the night before your surgery   Take these medicines the morning of surgery with A SIP OF WATER: aspirin EC  buPROPion (WELLBUTRIN)  gabapentin (NEURONTIN)  ipratropium (ATROVENT) 0.03 % nasal spray  pantoprazole (PROTONIX)  propranolol  ER (INDERAL  LA)  verapamil  (CALAN -SR)    May take these medicines IF NEEDED: albuterol (VENTOLIN HFA) inhaler - please bring inhaler with you morning of surgery ALPRAZolam (XANAX)    One week prior to surgery, STOP taking any Aleve, Naproxen, Ibuprofen, Motrin, Advil, Goody's, BC's, all herbal medications, fish oil, and non-prescription vitamins.                     Do NOT Smoke (Tobacco/Vaping) for 24 hours prior to your procedure.  If you use a CPAP at night, you may bring your mask/headgear for your overnight stay.   You will be asked to remove any contacts, glasses, piercing's, hearing aid's, dentures/partials prior to surgery. Please bring cases for these items if needed.    Patients discharged the day of surgery will not be allowed to drive home, and someone needs to stay with them for 24 hours.  SURGICAL WAITING ROOM VISITATION Patients may have no more than 2 support people in the waiting area - these visitors may rotate.   Pre-op nurse will coordinate an appropriate time for 1 ADULT support person, who may not rotate, to accompany patient in pre-op.  Children under the age of 38 must have an adult with  them who is not the patient and must remain in the main waiting area with an adult.  If the patient needs to stay at the hospital during part of their recovery, the visitor guidelines for inpatient rooms apply.  Please refer to the [facility] website for the visitor guidelines for any additional information.   If you received a COVID test during your pre-op visit  it is requested that you wear a mask when out in public, stay away from anyone that may not be feeling well and notify your surgeon if you develop symptoms. If you have been in contact with anyone that has tested positive in the last 10 days please notify you surgeon.      Pre-operative CHG Bathing Instructions   You can play a key role in reducing the risk of infection after surgery. Your skin needs to be as free of germs as possible. You can reduce the number of germs on your skin by washing with CHG (chlorhexidine gluconate) soap before surgery. CHG is an antiseptic soap that kills germs and continues to kill germs even after washing.   DO NOT use if you have an allergy to chlorhexidine/CHG or antibacterial soaps. If your skin becomes reddened or irritated, stop using the CHG and notify one of our RNs at [phone].              TAKE A SHOWER THE NIGHT BEFORE SURGERY   Please keep in mind the following:  DO NOT shave, including legs and underarms, 48 hours prior to surgery.   You may shave your face before/day of surgery.  Place clean sheets on your bed the night before surgery Use a clean washcloth (not used since being washed) for shower. DO NOT sleep with pet's night before surgery.  CHG Shower Instructions:  Wash your face and private area with normal soap. If you choose to wash your hair, wash first with your normal shampoo.  After you use shampoo/soap, rinse your hair and body thoroughly to remove shampoo/soap residue.  Turn the water OFF and apply half the bottle of CHG soap to a CLEAN washcloth.  Apply CHG soap  ONLY FROM YOUR NECK DOWN TO YOUR TOES (washing for 3-5 minutes)  DO NOT use CHG soap on face, private areas, open wounds, or sores.  Pay special attention to the area where your surgery is being performed.  If you are having back surgery, having someone wash your back for you may be helpful. Wait 2 minutes after CHG soap is applied, then you may rinse off the CHG soap.  Pat dry with a clean towel  Put on clean pajamas    Additional instructions for the day of surgery: If you choose, you may shower the morning of surgery with an antibacterial soap.  DO NOT APPLY any lotions, deodorants, cologne, or perfumes.   Do not wear jewelry or makeup Do not wear nail polish, gel polish, artificial nails, or any other type of covering on natural nails (fingers and toes) Do not bring valuables to the hospital. [facility] is not responsible for valuables/personal belongings. Put on clean/comfortable clothes.  Please brush your teeth.  Ask your nurse before applying any prescription medications to the skin.

## 2024-10-22 ENCOUNTER — Encounter (HOSPITAL_COMMUNITY)
Admission: RE | Admit: 2024-10-22 | Discharge: 2024-10-22 | Disposition: A | Discharge status: Home / Self Care | Source: Ambulatory Visit | Attending: Neurosurgery | Admitting: Neurosurgery

## 2024-10-22 ENCOUNTER — Encounter (HOSPITAL_COMMUNITY): Payer: Self-pay

## 2024-10-22 ENCOUNTER — Other Ambulatory Visit: Payer: Self-pay

## 2024-10-22 VITALS — BP 164/91 | HR 93 | Temp 98.4°F | Resp 17 | Ht 63.0 in | Wt 219.3 lb

## 2024-10-22 DIAGNOSIS — I2510 Atherosclerotic heart disease of native coronary artery without angina pectoris: Secondary | ICD-10-CM | POA: Diagnosis not present

## 2024-10-22 DIAGNOSIS — Z01818 Encounter for other preprocedural examination: Secondary | ICD-10-CM | POA: Insufficient documentation

## 2024-10-22 HISTORY — DX: Depression, unspecified: F32.A

## 2024-10-22 HISTORY — DX: Anxiety disorder, unspecified: F41.9

## 2024-10-22 HISTORY — DX: Cardiac arrhythmia, unspecified: I49.9

## 2024-10-22 HISTORY — DX: Unspecified osteoarthritis, unspecified site: M19.90

## 2024-10-22 LAB — BASIC METABOLIC PANEL WITH GFR
Anion gap: 8 (ref 5–15)
BUN: 17 mg/dL (ref 8–23)
CO2: 29 mmol/L (ref 22–32)
Calcium: 8.7 mg/dL — ABNORMAL LOW (ref 8.9–10.3)
Chloride: 104 mmol/L (ref 98–111)
Creatinine, Ser: 0.8 mg/dL (ref 0.44–1.00)
GFR, Estimated: >60 mL/min
Glucose, Bld: 93 mg/dL (ref 70–99)
Potassium: 4.2 mmol/L (ref 3.5–5.1)
Sodium: 141 mmol/L (ref 135–145)

## 2024-10-22 LAB — CBC
HCT: 40 % (ref 36.0–46.0)
Hemoglobin: 12.6 g/dL (ref 12.0–15.0)
MCH: 29 pg (ref 26.0–34.0)
MCHC: 31.5 g/dL (ref 30.0–36.0)
MCV: 92.2 fL (ref 80.0–100.0)
Platelets: 304 K/uL (ref 150–400)
RBC: 4.34 MIL/uL (ref 3.87–5.11)
RDW: 13.4 % (ref 11.5–15.5)
WBC: 6.1 K/uL (ref 4.0–10.5)
nRBC: 0 % (ref 0.0–0.2)

## 2024-10-22 LAB — TYPE AND SCREEN
ABO/RH(D): A POS
Antibody Screen: NEGATIVE

## 2024-10-22 NOTE — Progress Notes (Addendum)
 PCP - Anthony Hint, FNP Cardiologist - Pt saw Emily Cruz in August 2022 for [place] and medication refills. She has not had any cardiac issues/changes since then/has not been back to cardiologist  PPM/ICD - Denies Device Orders - n/a Rep Notified - n/a  Chest x-ray - n/a EKG - 10/22/2024 Stress Test - 11/25/2019 ECHO - 05/20/2021 Cardiac Cath - Denies  Sleep Study - Denies CPAP - n/a  No DM  Last dose of GLP1 agonist- n/a GLP1 instructions: n/a  Blood Thinner Instructions: n/a Aspirin Instructions: Pt instructed to continue taking ASA through the morning of surgery  NPO after midnight  COVID TEST- n/a   Anesthesia review: Yes. Cardiac work-up within the last five years. Discussed with Lynwood Hope, PA-C  Patient denies shortness of breath, fever, cough and chest pain at PAT appointment. Pt endorses chest congestion and productive cough with clear phlegm that started three weeks ago. Chest congestion resolved about two weeks and she still has occasional dry cough at night (still taking mucinex). Discussed with Lynwood Hope, PA-C and pt okay to proceed with surgery.   All instructions explained to the patient, with a verbal understanding of the material. Patient agrees to go over the instructions while at home for a better understanding. Patient also instructed to self quarantine after being tested for COVID-19. The opportunity to ask questions was provided.

## 2024-10-27 NOTE — Anesthesia Preprocedure Evaluation (Signed)
"                                    Anesthesia Evaluation  Patient identified by MRN, date of birth, ID band Patient awake    Reviewed: Allergy & Precautions, NPO status , Patient's Chart, lab work & pertinent test results, reviewed documented beta blocker date and time   History of Anesthesia Complications Negative for: history of anesthetic complications  Airway Mallampati: II  TM Distance: >3 FB Neck ROM: Full    Dental  (+) Dental Advisory Given   Pulmonary asthma , COPD,  COPD inhaler   breath sounds clear to auscultation       Cardiovascular hypertension, Pt. on medications and Pt. on home beta blockers (-) angina  Rhythm:Regular Rate:Normal  '22 ECHO: EF 68%, normal LVF, normal RVF, trace AI   Neuro/Psych   Anxiety Depression    Meningioma glaucoma    GI/Hepatic Neg liver ROS,GERD  Medicated and Controlled,,  Endo/Other  BMI 39  Renal/GU negative Renal ROS     Musculoskeletal   Abdominal   Peds  Hematology Hb 12.6, plt 304k   Anesthesia Other Findings   Reproductive/Obstetrics                              Anesthesia Physical Anesthesia Plan  ASA: 3  Anesthesia Plan: General   Post-op Pain Management: Tylenol  PO (pre-op)*   Induction: Intravenous  PONV Risk Score and Plan: Ondansetron , Dexamethasone  and Treatment may vary due to age or medical condition  Airway Management Planned: Oral ETT  Additional Equipment: Arterial line  Intra-op Plan:   Post-operative Plan: Extubation in OR  Informed Consent: I have reviewed the patients History and Physical, chart, labs and discussed the procedure including the risks, benefits and alternatives for the proposed anesthesia with the patient or authorized representative who has indicated his/her understanding and acceptance.     Dental advisory given  Plan Discussed with: CRNA and Surgeon  Anesthesia Plan Comments:          Anesthesia Quick  Evaluation  "

## 2024-10-28 ENCOUNTER — Encounter (HOSPITAL_COMMUNITY): Payer: Self-pay | Admitting: Neurosurgery

## 2024-10-28 ENCOUNTER — Inpatient Hospital Stay (HOSPITAL_COMMUNITY): Payer: Self-pay | Admitting: Physician Assistant

## 2024-10-28 ENCOUNTER — Encounter (HOSPITAL_COMMUNITY)
Admission: RE | Disposition: A | Discharge status: Home Health | Payer: Self-pay | Source: Home / Self Care | Attending: Neurosurgery

## 2024-10-28 ENCOUNTER — Inpatient Hospital Stay (HOSPITAL_COMMUNITY)
Admission: RE | Admit: 2024-10-28 | Discharge: 2024-10-30 | DRG: 027 | Disposition: A | Discharge status: Home / Self Care | Attending: Neurosurgery | Admitting: Neurosurgery

## 2024-10-28 ENCOUNTER — Inpatient Hospital Stay (HOSPITAL_COMMUNITY): Payer: Self-pay | Admitting: Anesthesiology

## 2024-10-28 DIAGNOSIS — D329 Benign neoplasm of meninges, unspecified: Secondary | ICD-10-CM | POA: Diagnosis not present

## 2024-10-28 DIAGNOSIS — E785 Hyperlipidemia, unspecified: Secondary | ICD-10-CM | POA: Diagnosis present

## 2024-10-28 DIAGNOSIS — G629 Polyneuropathy, unspecified: Secondary | ICD-10-CM | POA: Diagnosis present

## 2024-10-28 DIAGNOSIS — F419 Anxiety disorder, unspecified: Secondary | ICD-10-CM | POA: Diagnosis present

## 2024-10-28 DIAGNOSIS — I10 Essential (primary) hypertension: Secondary | ICD-10-CM | POA: Diagnosis present

## 2024-10-28 DIAGNOSIS — D320 Benign neoplasm of cerebral meninges: Principal | ICD-10-CM | POA: Diagnosis present

## 2024-10-28 DIAGNOSIS — Z79899 Other long term (current) drug therapy: Secondary | ICD-10-CM

## 2024-10-28 DIAGNOSIS — R000 Tachycardia, unspecified: Secondary | ICD-10-CM | POA: Diagnosis present

## 2024-10-28 DIAGNOSIS — F32A Depression, unspecified: Secondary | ICD-10-CM | POA: Diagnosis present

## 2024-10-28 DIAGNOSIS — J449 Chronic obstructive pulmonary disease, unspecified: Secondary | ICD-10-CM | POA: Diagnosis not present

## 2024-10-28 DIAGNOSIS — Z91018 Allergy to other foods: Secondary | ICD-10-CM

## 2024-10-28 DIAGNOSIS — K219 Gastro-esophageal reflux disease without esophagitis: Secondary | ICD-10-CM | POA: Diagnosis present

## 2024-10-28 DIAGNOSIS — H5462 Unqualified visual loss, left eye, normal vision right eye: Secondary | ICD-10-CM | POA: Diagnosis present

## 2024-10-28 DIAGNOSIS — Z833 Family history of diabetes mellitus: Secondary | ICD-10-CM

## 2024-10-28 DIAGNOSIS — J45909 Unspecified asthma, uncomplicated: Secondary | ICD-10-CM | POA: Diagnosis present

## 2024-10-28 DIAGNOSIS — Z96652 Presence of left artificial knee joint: Secondary | ICD-10-CM | POA: Diagnosis present

## 2024-10-28 DIAGNOSIS — Z8249 Family history of ischemic heart disease and other diseases of the circulatory system: Secondary | ICD-10-CM

## 2024-10-28 HISTORY — PX: CRANIOTOMY: SHX93

## 2024-10-28 HISTORY — PX: APPLICATION OF CRANIAL NAVIGATION: SHX6578

## 2024-10-28 LAB — POCT I-STAT 7, (LYTES, BLD GAS, ICA,H+H)
Acid-base deficit: 2 mmol/L (ref 0.0–2.0)
Acid-base deficit: 2 mmol/L (ref 0.0–2.0)
Bicarbonate: 22.1 mmol/L (ref 20.0–28.0)
Bicarbonate: 21.1 mmol/L (ref 20.0–28.0)
Calcium, Ion: 1.16 mmol/L (ref 1.15–1.40)
Calcium, Ion: 1.16 mmol/L (ref 1.15–1.40)
HCT: 34 % — ABNORMAL LOW (ref 36.0–46.0)
HCT: 31 % — ABNORMAL LOW (ref 36.0–46.0)
Hemoglobin: 11.6 g/dL — ABNORMAL LOW (ref 12.0–15.0)
Hemoglobin: 10.5 g/dL — ABNORMAL LOW (ref 12.0–15.0)
O2 Saturation: 100 %
O2 Saturation: 100 %
Potassium: 3.4 mmol/L — ABNORMAL LOW (ref 3.5–5.1)
Potassium: 3.3 mmol/L — ABNORMAL LOW (ref 3.5–5.1)
Sodium: 141 mmol/L (ref 135–145)
Sodium: 142 mmol/L (ref 135–145)
TCO2: 23 mmol/L (ref 22–32)
TCO2: 22 mmol/L (ref 22–32)
pCO2 arterial: 34 mmHg (ref 32–48)
pCO2 arterial: 30.7 mmHg — ABNORMAL LOW (ref 32–48)
pH, Arterial: 7.422 (ref 7.35–7.45)
pH, Arterial: 7.446 (ref 7.35–7.45)
pO2, Arterial: 240 mmHg — ABNORMAL HIGH (ref 83–108)
pO2, Arterial: 246 mmHg — ABNORMAL HIGH (ref 83–108)

## 2024-10-28 LAB — MRSA NEXT GEN BY PCR, NASAL: MRSA by PCR Next Gen: NOT DETECTED

## 2024-10-28 LAB — ABO/RH: ABO/RH(D): A POS

## 2024-10-28 MED ORDER — ROCURONIUM BROMIDE 10 MG/ML (PF) SYRINGE
PREFILLED_SYRINGE | INTRAVENOUS | Status: DC | PRN
  Administered 2024-10-28: 30 mg via INTRAVENOUS
  Administered 2024-10-28: 70 mg via INTRAVENOUS
  Administered 2024-10-28: 20 mg via INTRAVENOUS

## 2024-10-28 MED ORDER — LABETALOL HCL 5 MG/ML IV SOLN
10.0000 mg | INTRAVENOUS | Status: DC | PRN
  Administered 2024-10-28: 20 mg via INTRAVENOUS
  Administered 2024-10-28: 30 mg via INTRAVENOUS
  Administered 2024-10-28: 20 mg via INTRAVENOUS
  Administered 2024-10-29: 10 mg via INTRAVENOUS
  Administered 2024-10-29: 5 mg via INTRAVENOUS
  Administered 2024-10-29: 10 mg via INTRAVENOUS
  Filled 2024-10-28 (×4): qty 4
  Filled 2024-10-28 (×2): qty 8

## 2024-10-28 MED ORDER — CEFAZOLIN SODIUM-DEXTROSE 2-4 GM/100ML-% IV SOLN
2.0000 g | Freq: Three times a day (TID) | INTRAVENOUS | Status: DC
  Administered 2024-10-28 – 2024-10-29 (×3): 2 g via INTRAVENOUS
  Filled 2024-10-28 (×3): qty 100

## 2024-10-28 MED ORDER — ACETAMINOPHEN 650 MG RE SUPP: 650.0000 mg | RECTAL | Status: DC | PRN

## 2024-10-28 MED ORDER — LABETALOL HCL 5 MG/ML IV SOLN
INTRAVENOUS | Status: DC | PRN
  Administered 2024-10-28 (×2): 10 mg via INTRAVENOUS

## 2024-10-28 MED ORDER — ESMOLOL HCL 100 MG/10ML IV SOLN
INTRAVENOUS | Status: AC
  Filled 2024-10-28: qty 10

## 2024-10-28 MED ORDER — PROPOFOL 10 MG/ML IV BOLUS
INTRAVENOUS | Status: AC
  Filled 2024-10-28: qty 20

## 2024-10-28 MED ORDER — ALBUMIN HUMAN 5 % IV SOLN: INTRAVENOUS | Status: DC | PRN

## 2024-10-28 MED ORDER — LIDOCAINE-EPINEPHRINE 1 %-1:100000 IJ SOLN
INTRAMUSCULAR | Status: DC | PRN
  Administered 2024-10-28: 13 mL

## 2024-10-28 MED ORDER — OXYCODONE HCL 5 MG/5ML PO SOLN: 5.0000 mg | Freq: Once | ORAL | Status: DC | PRN

## 2024-10-28 MED ORDER — ACETAMINOPHEN 500 MG PO TABS
1000.0000 mg | ORAL_TABLET | Freq: Once | ORAL | Status: AC
  Administered 2024-10-28: 1000 mg via ORAL
  Filled 2024-10-28: qty 2

## 2024-10-28 MED ORDER — LIDOCAINE 2% (20 MG/ML) 5 ML SYRINGE
INTRAMUSCULAR | Status: AC
  Filled 2024-10-28: qty 5

## 2024-10-28 MED ORDER — FENTANYL CITRATE (PF) 250 MCG/5ML IJ SOLN
INTRAMUSCULAR | Status: AC
  Filled 2024-10-28: qty 5

## 2024-10-28 MED ORDER — REMIFENTANIL HCL 2 MG IV SOLR
INTRAVENOUS | Status: AC
  Filled 2024-10-28: qty 2000

## 2024-10-28 MED ORDER — PROPOFOL 500 MG/50ML IV EMUL
INTRAVENOUS | Status: DC | PRN
  Administered 2024-10-28: 75 ug/kg/min via INTRAVENOUS
  Administered 2024-10-28: 25 mg via INTRAVENOUS
  Administered 2024-10-28: 75 ug/kg/min via INTRAVENOUS

## 2024-10-28 MED ORDER — ROCURONIUM BROMIDE 10 MG/ML (PF) SYRINGE
PREFILLED_SYRINGE | INTRAVENOUS | Status: AC
  Filled 2024-10-28: qty 10

## 2024-10-28 MED ORDER — PHENYLEPHRINE 80 MCG/ML (10ML) SYRINGE FOR IV PUSH (FOR BLOOD PRESSURE SUPPORT)
PREFILLED_SYRINGE | INTRAVENOUS | Status: AC
  Filled 2024-10-28: qty 10

## 2024-10-28 MED ORDER — OXYCODONE HCL 5 MG PO TABS: 5.0000 mg | ORAL_TABLET | Freq: Once | ORAL | Status: DC | PRN

## 2024-10-28 MED ORDER — PHENYLEPHRINE HCL-NACL 20-0.9 MG/250ML-% IV SOLN
INTRAVENOUS | Status: DC | PRN
  Administered 2024-10-28: 25 ug/min via INTRAVENOUS

## 2024-10-28 MED ORDER — LIDOCAINE-EPINEPHRINE 1 %-1:100000 IJ SOLN
INTRAMUSCULAR | Status: AC
  Filled 2024-10-28: qty 1

## 2024-10-28 MED ORDER — GABAPENTIN 100 MG PO CAPS
100.0000 mg | ORAL_CAPSULE | Freq: Three times a day (TID) | ORAL | Status: DC
  Administered 2024-10-28 – 2024-10-30 (×6): 100 mg via ORAL
  Filled 2024-10-28 (×6): qty 1

## 2024-10-28 MED ORDER — ONDANSETRON HCL 4 MG PO TABS: 4.0000 mg | ORAL_TABLET | ORAL | Status: DC | PRN

## 2024-10-28 MED ORDER — LABETALOL HCL 5 MG/ML IV SOLN
INTRAVENOUS | Status: AC
  Filled 2024-10-28: qty 4

## 2024-10-28 MED ORDER — ONDANSETRON HCL 4 MG/2ML IJ SOLN
INTRAMUSCULAR | Status: AC
  Filled 2024-10-28: qty 2

## 2024-10-28 MED ORDER — EPHEDRINE SULFATE-NACL 50-0.9 MG/10ML-% IV SOSY
PREFILLED_SYRINGE | INTRAVENOUS | Status: DC | PRN
  Administered 2024-10-28 (×2): 5 mg via INTRAVENOUS

## 2024-10-28 MED ORDER — BACITRACIN ZINC 500 UNIT/GM EX OINT
TOPICAL_OINTMENT | CUTANEOUS | Status: AC
  Filled 2024-10-28: qty 28.35

## 2024-10-28 MED ORDER — HEMOSTATIC AGENTS (NO CHARGE) OPTIME
TOPICAL | Status: DC | PRN
  Administered 2024-10-28: 1 via TOPICAL

## 2024-10-28 MED ORDER — LIDOCAINE 2% (20 MG/ML) 5 ML SYRINGE
INTRAMUSCULAR | Status: DC | PRN
  Administered 2024-10-28: 20 mg via INTRAVENOUS

## 2024-10-28 MED ORDER — CLEVIDIPINE BUTYRATE 0.5 MG/ML IV EMUL
0.0000 mg/h | INTRAVENOUS | Status: DC
  Administered 2024-10-28 (×2): 12 mg/h via INTRAVENOUS
  Administered 2024-10-28: 13 mg/h via INTRAVENOUS
  Administered 2024-10-28: 14 mg/h via INTRAVENOUS
  Administered 2024-10-28: 12 mg/h via INTRAVENOUS
  Administered 2024-10-28: 2 mg/h via INTRAVENOUS
  Administered 2024-10-28: 7 mg/h via INTRAVENOUS
  Administered 2024-10-29 (×4): 15 mg/h via INTRAVENOUS
  Filled 2024-10-28 (×11): qty 50

## 2024-10-28 MED ORDER — PHENYLEPHRINE 80 MCG/ML (10ML) SYRINGE FOR IV PUSH (FOR BLOOD PRESSURE SUPPORT)
PREFILLED_SYRINGE | INTRAVENOUS | Status: DC | PRN
  Administered 2024-10-28: 80 ug via INTRAVENOUS

## 2024-10-28 MED ORDER — ACETAMINOPHEN 325 MG PO TABS
650.0000 mg | ORAL_TABLET | ORAL | Status: DC | PRN
  Filled 2024-10-28: qty 2

## 2024-10-28 MED ORDER — FENTANYL CITRATE (PF) 100 MCG/2ML IJ SOLN: 25.0000 ug | INTRAMUSCULAR | Status: DC | PRN

## 2024-10-28 MED ORDER — LACTATED RINGERS IV SOLN: INTRAVENOUS | Status: DC

## 2024-10-28 MED ORDER — SODIUM CHLORIDE 0.9 % IV SOLN: INTRAVENOUS | Status: DC | PRN

## 2024-10-28 MED ORDER — PANTOPRAZOLE SODIUM 40 MG IV SOLR
40.0000 mg | Freq: Every day | INTRAVENOUS | Status: DC
  Administered 2024-10-28: 40 mg via INTRAVENOUS
  Filled 2024-10-28: qty 10

## 2024-10-28 MED ORDER — 0.9 % SODIUM CHLORIDE (POUR BTL) OPTIME
TOPICAL | Status: DC | PRN
  Administered 2024-10-28: 3000 mL

## 2024-10-28 MED ORDER — FENTANYL CITRATE (PF) 50 MCG/ML IJ SOSY
25.0000 ug | PREFILLED_SYRINGE | INTRAMUSCULAR | Status: AC | PRN
  Administered 2024-10-28 – 2024-10-29 (×2): 25 ug via INTRAVENOUS
  Filled 2024-10-28 (×2): qty 1

## 2024-10-28 MED ORDER — PROPOFOL 10 MG/ML IV BOLUS
INTRAVENOUS | Status: DC | PRN
  Administered 2024-10-28: 25 mg via INTRAVENOUS
  Administered 2024-10-28: 80 mg via INTRAVENOUS
  Administered 2024-10-28: 40 mg via INTRAVENOUS
  Administered 2024-10-28: 80 mg via INTRAVENOUS

## 2024-10-28 MED ORDER — LEVETIRACETAM (KEPPRA) 500 MG/5 ML ADULT IV PUSH
500.0000 mg | Freq: Two times a day (BID) | INTRAVENOUS | Status: DC
  Administered 2024-10-28 – 2024-10-29 (×2): 500 mg via INTRAVENOUS
  Filled 2024-10-28 (×2): qty 5

## 2024-10-28 MED ORDER — VANCOMYCIN HCL 1000 MG IV SOLR
INTRAVENOUS | Status: AC
  Filled 2024-10-28: qty 20

## 2024-10-28 MED ORDER — LEVETIRACETAM 500 MG/5ML IV SOLN
INTRAVENOUS | Status: AC
  Filled 2024-10-28: qty 10

## 2024-10-28 MED ORDER — POTASSIUM CHLORIDE IN NACL 20-0.9 MEQ/L-% IV SOLN
INTRAVENOUS | Status: AC
  Filled 2024-10-28: qty 1000

## 2024-10-28 MED ORDER — SODIUM CHLORIDE 0.9 % IV SOLN
0.1500 ug/kg/min | INTRAVENOUS | Status: DC
  Filled 2024-10-28: qty 2000

## 2024-10-28 MED ORDER — IPRATROPIUM-ALBUTEROL 0.5-2.5 (3) MG/3ML IN SOLN: 3.0000 mL | RESPIRATORY_TRACT | Status: DC | PRN

## 2024-10-28 MED ORDER — BUTALBITAL-APAP-CAFFEINE 50-325-40 MG PO TABS
2.0000 | ORAL_TABLET | ORAL | Status: DC | PRN
  Administered 2024-10-28: 2 via ORAL
  Filled 2024-10-28: qty 2

## 2024-10-28 MED ORDER — LOSARTAN POTASSIUM 50 MG PO TABS
50.0000 mg | ORAL_TABLET | Freq: Every day | ORAL | Status: DC
  Administered 2024-10-28: 50 mg via ORAL
  Filled 2024-10-28: qty 1

## 2024-10-28 MED ORDER — FENTANYL CITRATE (PF) 250 MCG/5ML IJ SOLN
INTRAMUSCULAR | Status: DC | PRN
  Administered 2024-10-28: 250 ug via INTRAVENOUS

## 2024-10-28 MED ORDER — SURGIFLO WITH THROMBIN (HEMOSTATIC MATRIX KIT) OPTIME
TOPICAL | Status: DC | PRN
  Administered 2024-10-28: 1 via TOPICAL

## 2024-10-28 MED ORDER — CHLORHEXIDINE GLUCONATE 0.12 % MT SOLN
15.0000 mL | Freq: Once | OROMUCOSAL | Status: AC
  Administered 2024-10-28: 15 mL via OROMUCOSAL
  Filled 2024-10-28: qty 15

## 2024-10-28 MED ORDER — DEXAMETHASONE 4 MG PO TABS
6.0000 mg | ORAL_TABLET | Freq: Four times a day (QID) | ORAL | Status: AC
  Administered 2024-10-28 – 2024-10-29 (×4): 6 mg via ORAL
  Filled 2024-10-28 (×4): qty 2

## 2024-10-28 MED ORDER — SODIUM CHLORIDE 0.9 % IV SOLN
INTRAVENOUS | Status: DC | PRN
  Administered 2024-10-28: 1000 mg via INTRAVENOUS

## 2024-10-28 MED ORDER — CEFAZOLIN SODIUM-DEXTROSE 2-4 GM/100ML-% IV SOLN
INTRAVENOUS | Status: AC
  Filled 2024-10-28: qty 100

## 2024-10-28 MED ORDER — MIDAZOLAM HCL (PF) 2 MG/2ML IJ SOLN: 0.5000 mg | Freq: Once | INTRAMUSCULAR | Status: DC | PRN

## 2024-10-28 MED ORDER — ONDANSETRON HCL 4 MG/2ML IJ SOLN
4.0000 mg | INTRAMUSCULAR | Status: DC | PRN
  Administered 2024-10-28: 4 mg via INTRAVENOUS
  Filled 2024-10-28: qty 2

## 2024-10-28 MED ORDER — DEXAMETHASONE 4 MG PO TABS
4.0000 mg | ORAL_TABLET | Freq: Four times a day (QID) | ORAL | Status: AC
  Administered 2024-10-29 – 2024-10-30 (×4): 4 mg via ORAL
  Filled 2024-10-28 (×4): qty 1

## 2024-10-28 MED ORDER — EPHEDRINE 5 MG/ML INJ
INTRAVENOUS | Status: AC
  Filled 2024-10-28: qty 5

## 2024-10-28 MED ORDER — VERAPAMIL HCL ER 240 MG PO TBCR
240.0000 mg | EXTENDED_RELEASE_TABLET | Freq: Every day | ORAL | Status: DC
  Administered 2024-10-29 – 2024-10-30 (×2): 240 mg via ORAL
  Filled 2024-10-28 (×2): qty 1

## 2024-10-28 MED ORDER — ONDANSETRON HCL 4 MG/2ML IJ SOLN
INTRAMUSCULAR | Status: DC | PRN
  Administered 2024-10-28: 4 mg via INTRAVENOUS

## 2024-10-28 MED ORDER — PRAVASTATIN SODIUM 40 MG PO TABS
20.0000 mg | ORAL_TABLET | Freq: Every evening | ORAL | Status: DC
  Administered 2024-10-28 – 2024-10-29 (×2): 20 mg via ORAL
  Filled 2024-10-28: qty 2
  Filled 2024-10-28: qty 1

## 2024-10-28 MED ORDER — SUGAMMADEX SODIUM 200 MG/2ML IV SOLN
INTRAVENOUS | Status: DC | PRN
  Administered 2024-10-28: 200 mg via INTRAVENOUS

## 2024-10-28 MED ORDER — SODIUM CHLORIDE 0.9 % IV SOLN
INTRAVENOUS | Status: DC | PRN
  Administered 2024-10-28 (×2): .2 ug/kg/min via INTRAVENOUS

## 2024-10-28 MED ORDER — DEXAMETHASONE SOD PHOSPHATE PF 10 MG/ML IJ SOLN
INTRAMUSCULAR | Status: DC | PRN
  Administered 2024-10-28: 10 mg via INTRAVENOUS

## 2024-10-28 MED ORDER — DEXAMETHASONE SOD PHOSPHATE PF 10 MG/ML IJ SOLN
INTRAMUSCULAR | Status: AC
  Filled 2024-10-28: qty 1

## 2024-10-28 MED ORDER — DEXAMETHASONE 4 MG PO TABS: 4.0000 mg | ORAL_TABLET | Freq: Three times a day (TID) | ORAL | Status: DC

## 2024-10-28 MED ORDER — ORAL CARE MOUTH RINSE: 15.0000 mL | Freq: Once | OROMUCOSAL | Status: AC

## 2024-10-28 MED ORDER — PROMETHAZINE HCL 25 MG PO TABS
12.5000 mg | ORAL_TABLET | ORAL | Status: DC | PRN
  Administered 2024-10-28: 25 mg via ORAL
  Filled 2024-10-28: qty 1

## 2024-10-28 MED ORDER — CEFAZOLIN IN SODIUM CHLORIDE 2-0.9 GM/100ML-% IV SOLN
2.0000 g | Freq: Once | INTRAVENOUS | Status: AC
  Administered 2024-10-28: 2 g via INTRAVENOUS
  Filled 2024-10-28: qty 100

## 2024-10-28 MED ORDER — PROPRANOLOL HCL ER 80 MG PO CP24
80.0000 mg | ORAL_CAPSULE | Freq: Every morning | ORAL | Status: DC
  Administered 2024-10-29 – 2024-10-30 (×2): 80 mg via ORAL
  Filled 2024-10-28 (×2): qty 1

## 2024-10-28 MED ORDER — FENTANYL CITRATE (PF) 100 MCG/2ML IJ SOLN
INTRAMUSCULAR | Status: AC
  Filled 2024-10-28: qty 2

## 2024-10-28 MED ORDER — MEPERIDINE HCL 25 MG/ML IJ SOLN: 6.2500 mg | INTRAMUSCULAR | Status: DC | PRN

## 2024-10-28 MED ORDER — VANCOMYCIN HCL 1000 MG IV SOLR
INTRAVENOUS | Status: DC | PRN
  Administered 2024-10-28: 1000 mg

## 2024-10-28 NOTE — H&P (Signed)
 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  Past Medical History:  Diagnosis Date   Anxiety    Arthritis    Asthma    Depression    Dysrhythmia    Tachycardia   Hyperlipidemia    Hypertension    Past Surgical History:  Procedure Laterality Date   BREAST LUMPECTOMY Left 1976   CATARACT EXTRACTION W/ INTRAOCULAR LENS IMPLANT Bilateral 2021   CHOLECYSTECTOMY  2005   COLONOSCOPY     KNEE ARTHROPLASTY Left 12/2019   TONSILLECTOMY AND ADENOIDECTOMY  1958   Social History   Socioeconomic History   Marital status: Single    Spouse name: Not on file   Number of children: 0   Years of education: Not on file   Highest education level: Not on file  Occupational History   Not on file  Tobacco Use   Smoking status: Never   Smokeless tobacco: Never  Vaping Use   Vaping status: Never Used  Substance and Sexual Activity   Alcohol use: Never   Drug use: Yes    Comment: CBD sleep gummies   Sexual activity: Not Currently  Other Topics Concern   Not on file  Social History Narrative   Not on file   Social Drivers of Health   Tobacco Use: Low Risk (10/28/2024)   Patient History    Smoking Tobacco Use: Never    Smokeless Tobacco Use: Never    Passive Exposure: Not on file  Financial Resource Strain: Not on file  Food Insecurity: Low Risk (01/27/2023)   Received from [facility]   Epic    Within the past 12 months, you worried that your food would run out before you got money to buy more: Never true    Within the past 12 months, the food you bought just didn't last and you didn't have money to get more. : Never true  Transportation Needs: Not on file (01/27/2023)  Physical Activity: Not on file  Stress: Not on file  Social Connections: Not on file  Intimate Partner Violence: Not on file  Depression (EYV7-0): Not on file  Alcohol Screen: Not on file  Housing: Low Risk (01/27/2023)   Received from [facility]   Epic    What is your living situation today?: I have a  steady place to live    Think about the place you live. Do you have problems with any of the following? Choose all that apply:: Not on file  Utilities: Low Risk (01/27/2023)   Received from [facility]   Utilities    In the past 12 months has the electric, gas, oil, or water company threatened to shut off services in your home? : No  Health Literacy: Not on file   Family History  Problem Relation Age of Onset   Diabetes Mother    Heart disease Mother        2017    2 stents   Allergies[1] Scheduled Meds: Continuous Infusions:  ceFAZolin      ceFAZolin      lactated ringers      PRN Meds:.ceFAZolin  Vitals:   10/28/24 0553  BP: (!) (P) 171/93  Pulse: (P) 86  Resp: (P) 18  Temp: (P) 98.2 F (36.8 C)  SpO2: (P) 93%   Physical Exam HENT:     Head: Normocephalic.     Nose: Nose normal.  Eyes:     Pupils: Pupils are equal, round, and reactive to light.  Cardiovascular:     Rate and Rhythm: Normal  rate.  Pulmonary:     Effort: Pulmonary effort is normal.  Abdominal:     General: Abdomen is flat.  Musculoskeletal:     Cervical back: Normal range of motion.  Neurological:     Mental Status: She is alert.   ASSESSMENT: TUBERCULUM SELLAE MENINGIOMA  PLAN: LEFT CRANIOTOMY FOR TUMOR     [1]  Allergies Allergen Reactions   Coconut Flavoring Agent (Non-Screening) Anaphylaxis    food   Coconut (Cocos Nucifera)

## 2024-10-28 NOTE — Anesthesia Procedure Notes (Signed)
 Arterial Line Insertion Start/End1/09/2025 7:15 AM, 10/28/2024 7:20 AM Performed by: Claudene Arlin LABOR, CRNA, CRNA  Patient location: Pre-op. Preanesthetic checklist: patient identified, IV checked, site marked, risks and benefits discussed, surgical consent, monitors and equipment checked, pre-op evaluation, timeout performed and anesthesia consent Lidocaine  1% used for infiltration Right, radial was placed Catheter size: 20 G Hand hygiene performed  and maximum sterile barriers used  Allen's test indicative of satisfactory collateral circulation Attempts: 1 Following insertion, dressing applied and Biopatch. Post procedure assessment: normal and unchanged  Patient tolerated the procedure well with no immediate complications.

## 2024-10-28 NOTE — Transfer of Care (Signed)
 Immediate Anesthesia Transfer of Care Note  Patient: Emily Cruz  Procedure(s) Performed: LEFT CRANIOTOMY FOR TUMOR EXCISION (Left: Head) COMPUTER-ASSISTED NAVIGATION, FOR CRANIAL PROCEDURE (Left)  Patient Location: PACU  Anesthesia Type:General  Level of Consciousness: drowsy and patient cooperative  Airway & Oxygen Therapy: Patient Spontanous Breathing and Patient connected to nasal cannula oxygen  Post-op Assessment: Report given to RN, Post -op Vital signs reviewed and stable, and Patient moving all extremities X 4  Post vital signs: Reviewed and stable  Last Vitals:  Vitals Value Taken Time  BP 147/82 10/28/24 11:11  Temp    Pulse 89 10/28/24 11:14  Resp 12 10/28/24 11:14  SpO2 92 % 10/28/24 11:14  Vitals shown include unfiled device data.  Last Pain:  Vitals:   10/28/24 0606  TempSrc:   PainSc: 0-No pain      Patients Stated Pain Goal: 0 (10/28/24 0606)  Complications: No notable events documented.

## 2024-10-28 NOTE — Anesthesia Procedure Notes (Signed)
 Procedure Name: Intubation Date/Time: 10/28/2024 7:47 AM  Performed by: Claudene Arlin LABOR, CRNAPre-anesthesia Checklist: Patient identified, Emergency Drugs available, Suction available and Patient being monitored Patient Re-evaluated:Patient Re-evaluated prior to induction Oxygen Delivery Method: Circle system utilized Preoxygenation: Pre-oxygenation with 100% oxygen Induction Type: IV induction Ventilation: Mask ventilation without difficulty Laryngoscope Size: Mac and 4 Grade View: Grade I Tube type: Oral Tube size: 7.0 mm Number of attempts: 1 Airway Equipment and Method: Stylet Placement Confirmation: ETT inserted through vocal cords under direct vision, positive ETCO2 and breath sounds checked- equal and bilateral Secured at: 22 cm Tube secured with: Tape Dental Injury: Teeth and Oropharynx as per pre-operative assessment

## 2024-10-28 NOTE — Op Note (Signed)
 DATE OF SURGERY: 10/28/2024   ATTENDING SURGEON: Dino Sable, MD   ASSISTANT: None   PREOPERATIVE DIAGNOSIS: Tuberculum Sellae Meningioma   POSTOPERATIVE DIAGNOSIS: Same    PROCEDURE PERFORMED:  1. RIGHT craniotomy doe skull base meningioma 2. Right orbitotomy 3. Microscopic techniques 4. Neuronavigation 5. Cranioplasty <5cm  ANESTHESIA: General endotracheal anesthesia.     ESTIMATED BLOOD LOSS, URINE OUTPUT, AND CRYSTALLOIDS:   See anesthesia chart.     COMPLICATIONS: None.     SPECIMENS: Tumor   DRAINS: JP    PREOPERATIVE COURSE:   74 year old lady with a mass of the tuberculum sella with compression upon the left optic nerve with visual loss to a greater degree than the right.  We discussed the option for tumor treatment with her including but not limited to doing nothing versus radiation versus surgery and I discussed the endonasal versus open surgical treatment with her.  We talked about the risks and benefits including but not limited to infection, hemorrhage, stroke, paralysis, blindness [temporary versus permanent], CSF leak, seizures, DVT/PE, cardiopulmonary complications and death.  After answering all her questions, she requested for us  to proceed.   DESCRIPTION OF PROCEDURE: Patient brought to the operating room and after general trach anesthesia with the soon she was placed in supine position with the head turned to the right on a horseshoe.  Hair was parted and stapled down and a curvilinear incision was outlined.  She was prepped and draped in usual sterile fashion after which the outline was injected with lidocaine  with epinephrine  and the skin incision was made and reflected anteriorly in an interfascial dissection fashion.  The temporalis muscle was reflected inferiorly and posteriorly after which a keyhole bur hole was made as well as a squamous temporal bur hole and a frontal craniotomy was performed as well as an orbitotomy in 1 piece.  The orbit was  then slightly depressed after which the sphenoid ridge was drilled down.  The microscope was then brought in for microscopic dissection and the frontal dura was dissected away from the orbital roof.  The dura was then opened over the tumor and after navigation confirmed the localization and internal debulking was performed.  The tumor was being fed from the tuberculum and the feeders were coagulated and then tumor attachment attached.  The tumor was pushing up the left optic nerve as well as infiltrating between the optic and the carotid.  It was gently dissected away from it.  Then the tumor was reflected away from the anterior cerebral arteries and the dissection continued contralaterally.  Here the tumor was infiltrating into the optic canal and the falciform ligament was opened and the tumor was then removed.  Inspection was performed on the ipsilateral canal as well and no tumor infiltration into the optic canal was found.  In this fashion, the tumor was removed in a piecemeal fashion.  Careful inspection did not show any residual tumor and the base was coagulated.  After copious irrigation was performed the cavity lined with Surgicel after which Amoxil was taken out.  Duraplasty was performed with DuraGen after which the bone was replaced and secured with titanium plates and screws and the rongeured away part was reconstructed with titanium plates and screws.  Copious irrigation was performed again after which the temporalis muscle was reapproximated and a subgaleal drain was left in situ.  The galea and skin were closed in multiple layers.  At the end of the procedure all counts complete.   It should be noted that  Stereotactic computer-assisted navigation was indicated due to patient's anatomy. Films were reviewed for preop planning. Registration was personally performed by the me. Visual confirmation was done. Navigation was utilized during different portions of the surgery, including on entry to the  brain and localization of the tumor.

## 2024-10-28 NOTE — Anesthesia Postprocedure Evaluation (Signed)
"   Anesthesia Post Note  Patient: Emily Cruz  Procedure(s) Performed: LEFT CRANIOTOMY FOR TUMOR EXCISION (Left: Head) COMPUTER-ASSISTED NAVIGATION, FOR CRANIAL PROCEDURE (Left)     Patient location during evaluation: PACU Anesthesia Type: General Level of consciousness: awake and alert, oriented and patient cooperative Pain management: pain level controlled Vital Signs Assessment: post-procedure vital signs reviewed and stable Respiratory status: spontaneous breathing, nonlabored ventilation, respiratory function stable and patient connected to nasal cannula oxygen Cardiovascular status: blood pressure returned to baseline and stable Postop Assessment: no apparent nausea or vomiting Anesthetic complications: no   No notable events documented.  Last Vitals:  Vitals:   10/28/24 1200 10/28/24 1222  BP: (!) 160/88 (!) 147/95  Pulse: 85 83  Resp: 16 17  Temp:    SpO2: 96% 99%    Last Pain:  Vitals:   10/28/24 1145  TempSrc:   PainSc: 0-No pain                 Tan Clopper,E. Quaron Delacruz      "

## 2024-10-28 NOTE — Consult Note (Signed)
 "  NAME:  Emily Cruz, MRN:  [id number], DOB:  Feb 26, 1951, LOS: 0 ADMISSION DATE:  10/28/2024, CONSULTATION DATE:  1/12 REFERRING MD:  Dr. Rosslyn, CHIEF COMPLAINT:  meningioma s/p resection   History of Present Illness:  Patient is a 74 yo F w/ pertinent PMH meningioma presenting to [facility] on 1/12 for elective resection.  Patient developed progressive vision loss left eye worse than right eye, back in 08/2024. Seen by NSG. 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 Decision made for elective surgery.  Patient arrived to [facility] on 1/12 for elective surgery. S/p crani and resection. Post op brought to neuro icu. Pccm consulted.  Pertinent  Medical History   Past Medical History:  Diagnosis Date   Anxiety    Arthritis    Asthma    Depression    Dysrhythmia    Tachycardia   Hyperlipidemia    Hypertension      Significant Hospital Events: Including procedures, antibiotic start and stop dates in addition to other pertinent events   1/12 s/p crani w/ tumor resection  Interim History / Subjective:  See above  Objective    Blood pressure (!) 171/93, pulse 86, temperature 98.2 F (36.8 C), temperature source Oral, resp. rate 18, height 5' 3 (1.6 m), weight 99.5 kg, SpO2 93%.        Intake/Output Summary (Last 24 hours) at 10/28/2024 0936 Last data filed at 10/28/2024 9066 Gross per 24 hour  Intake 210 ml  Output 300 ml  Net -90 ml   Filed Weights   10/28/24 0553 10/28/24 9392  Weight: 97.5 kg 99.5 kg    Examination: General:   NAD HEENT: MM pink/moist; Nolic in place; drain in place Neuro: Aox3; MAE CV: s1s2, RRR, no m/r/g PULM:  dim clear BS bilaterally; [place]  GI: soft, bsx4 active  Extremities: warm/dry, no edema  Skin: no rashes or lesions    Resolved problem list   Assessment and Plan   Meningioma s/p crani and tumor resection Plan: -pain management: Tylenol , fioricet , and Fentanyl  25 mcg every 2 hours for pain; consider baclofen for  jaw pain -cont ancef  while drain in place -Decadron  -prn labetalol  SBP goal 100-150 -MRI tomorrow in am -keppra  for seizure ppx -neuro checks  Hx of anxiety/depression Plan: -hold home xanax  and wellbutrin  Hx of HTN/HLD Plan: -statin -prn iv labetalol  for sbp as above; resume home propanolol and verapamil   Peripheral neuropathy Plan: -gabapentin   GERD Plan: -protonix   Labs   CBC: Recent Labs  Lab 10/22/24 1130  WBC 6.1  HGB 12.6  HCT 40.0  MCV 92.2  PLT 304    Basic Metabolic Panel: Recent Labs  Lab 10/22/24 1130  NA 141  K 4.2  CL 104  CO2 29  GLUCOSE 93  BUN 17  CREATININE 0.80  CALCIUM 8.7*   GFR: Estimated Creatinine Clearance: 71.4 mL/min (by C-G formula based on SCr of 0.8 mg/dL). Recent Labs  Lab 10/22/24 1130  WBC 6.1    Liver Function Tests: No results for input(s): AST, ALT, ALKPHOS, BILITOT, PROT, ALBUMIN  in the last 168 hours. No results for input(s): LIPASE, AMYLASE in the last 168 hours. No results for input(s): AMMONIA in the last 168 hours.  ABG No results found for: PHART, PCO2ART, PO2ART, HCO3, TCO2, ACIDBASEDEF, O2SAT   Coagulation Profile: No results for input(s): INR, PROTIME in the last 168 hours.  Cardiac Enzymes: No results for input(s): CKTOTAL, CKMB, CKMBINDEX, TROPONINI in the last 168 hours.  HbA1C: No results found for: HGBA1C  CBG: No results for input(s): GLUCAP in the last 168 hours.  Review of Systems:   Review of Systems  Constitutional:  Negative for fever.  Eyes:  Positive for blurred vision.  Respiratory:  Negative for shortness of breath.   Cardiovascular:  Negative for chest pain.     Past Medical History:  She,  has a past medical history of Anxiety, Arthritis, Asthma, Depression, Dysrhythmia, Hyperlipidemia, and Hypertension.   Surgical History:   Past Surgical History:  Procedure Laterality Date   BREAST LUMPECTOMY Left 1976    CATARACT EXTRACTION W/ INTRAOCULAR LENS IMPLANT Bilateral 2021   CHOLECYSTECTOMY  2005   COLONOSCOPY     KNEE ARTHROPLASTY Left 12/2019   TONSILLECTOMY AND ADENOIDECTOMY  1958     Social History:   reports that she has never smoked. She has never used smokeless tobacco. She reports current drug use. She reports that she does not drink alcohol.   Family History:  Her family history includes Diabetes in her mother; Heart disease in her mother.   Allergies Allergies[1]   Home Medications  Prior to Admission medications  Medication Sig Start Date End Date Taking? Authorizing Provider  albuterol  (VENTOLIN  HFA) 108 (90 Base) MCG/ACT inhaler Inhale 2 puffs into the lungs every 6 (six) hours as needed for wheezing or shortness of breath.   Yes [provider]  ALPRAZolam  (XANAX ) 0.25 MG tablet Take 0.125-0.25 mg by mouth 2 (two) times daily as needed for anxiety. 10/28/19  Yes [provider]  aspirin EC 81 MG tablet Take 81 mg by mouth in the morning. Swallow whole.   Yes [provider]  buPROPion (WELLBUTRIN) 100 MG tablet Take 100 mg by mouth 2 (two) times daily. 10/02/19  Yes [provider]  CANNABIDIOL PO Take 0.5-1 each by mouth at bedtime as needed (sleep). CBD Gummies   Yes [provider]  gabapentin  (NEURONTIN ) 100 MG capsule Take 100 mg by mouth in the morning, at noon, and at bedtime. 05/07/21  Yes [provider]  ipratropium (ATROVENT) 0.03 % nasal spray Place 2 sprays into both nostrils 2 (two) times daily. 06/15/23  Yes [provider]  pantoprazole  (PROTONIX ) 40 MG tablet Take 40 mg by mouth in the morning. 10/08/18  Yes [provider]  pravastatin  (PRAVACHOL ) 20 MG tablet Take 20 mg by mouth every evening. 05/04/22  Yes [provider]  propranolol  ER (INDERAL  LA) 80 MG 24 hr capsule TAKE 1 CAPSULE BY MOUTH EVERY DAY Patient taking differently: Take 80 mg by mouth in the morning. 09/19/22  Yes Tolia,  Sunit, DO  verapamil  (CALAN -SR) 240 MG CR tablet Take 1 tablet (240 mg total) by mouth daily. Patient taking differently: Take 240 mg by mouth daily in the afternoon. 05/26/21 10/16/27 Yes Michele Richardson, DO     Critical care time: na     RODGER Emilio DEVONNA [facility] 10/28/2024, 9:36 AM  Please see [email or web address] for pager details.  From 7A-7P if no response, please call [phone]. After hours, please call ELink [phone].          [1]  Allergies Allergen Reactions   Coconut Flavoring Agent (Non-Screening) Anaphylaxis    food   Coconut (Cocos Nucifera)    "

## 2024-10-28 NOTE — Progress Notes (Signed)
 Patient belongings charted and placed into cabinet in room. Family notified of valuables policy.

## 2024-10-29 ENCOUNTER — Inpatient Hospital Stay (HOSPITAL_COMMUNITY)

## 2024-10-29 ENCOUNTER — Encounter (HOSPITAL_COMMUNITY): Payer: Self-pay | Admitting: Neurosurgery

## 2024-10-29 DIAGNOSIS — G629 Polyneuropathy, unspecified: Secondary | ICD-10-CM | POA: Diagnosis not present

## 2024-10-29 DIAGNOSIS — D329 Benign neoplasm of meninges, unspecified: Secondary | ICD-10-CM | POA: Diagnosis not present

## 2024-10-29 DIAGNOSIS — R569 Unspecified convulsions: Secondary | ICD-10-CM | POA: Diagnosis not present

## 2024-10-29 DIAGNOSIS — I10 Essential (primary) hypertension: Secondary | ICD-10-CM

## 2024-10-29 DIAGNOSIS — K219 Gastro-esophageal reflux disease without esophagitis: Secondary | ICD-10-CM | POA: Diagnosis not present

## 2024-10-29 DIAGNOSIS — Z8659 Personal history of other mental and behavioral disorders: Secondary | ICD-10-CM

## 2024-10-29 LAB — BASIC METABOLIC PANEL WITH GFR
Anion gap: 13 (ref 5–15)
BUN: 11 mg/dL (ref 8–23)
CO2: 20 mmol/L — ABNORMAL LOW (ref 22–32)
Calcium: 8.9 mg/dL (ref 8.9–10.3)
Chloride: 109 mmol/L (ref 98–111)
Creatinine, Ser: 0.68 mg/dL (ref 0.44–1.00)
GFR, Estimated: >60 mL/min
Glucose, Bld: 161 mg/dL — ABNORMAL HIGH (ref 70–99)
Potassium: 3.4 mmol/L — ABNORMAL LOW (ref 3.5–5.1)
Sodium: 142 mmol/L (ref 135–145)

## 2024-10-29 LAB — CBC
HCT: 32.8 % — ABNORMAL LOW (ref 36.0–46.0)
Hemoglobin: 10.8 g/dL — ABNORMAL LOW (ref 12.0–15.0)
MCH: 29.6 pg (ref 26.0–34.0)
MCHC: 32.9 g/dL (ref 30.0–36.0)
MCV: 89.9 fL (ref 80.0–100.0)
Platelets: 235 K/uL (ref 150–400)
RBC: 3.65 MIL/uL — ABNORMAL LOW (ref 3.87–5.11)
RDW: 13.6 % (ref 11.5–15.5)
WBC: 10.5 K/uL (ref 4.0–10.5)
nRBC: 0 % (ref 0.0–0.2)

## 2024-10-29 MED ORDER — LOSARTAN POTASSIUM 50 MG PO TABS
50.0000 mg | ORAL_TABLET | Freq: Every day | ORAL | Status: DC
  Administered 2024-10-29 – 2024-10-30 (×2): 50 mg via ORAL
  Filled 2024-10-29 (×2): qty 1

## 2024-10-29 MED ORDER — POTASSIUM CHLORIDE 10 MEQ/100ML IV SOLN
10.0000 meq | INTRAVENOUS | Status: AC
  Administered 2024-10-29 (×2): 10 meq via INTRAVENOUS
  Filled 2024-10-29 (×2): qty 100

## 2024-10-29 MED ORDER — POTASSIUM CHLORIDE 20 MEQ PO PACK
40.0000 meq | PACK | Freq: Once | ORAL | Status: AC
  Administered 2024-10-29: 40 meq via ORAL
  Filled 2024-10-29: qty 2

## 2024-10-29 MED ORDER — LOSARTAN POTASSIUM 50 MG PO TABS: 100.0000 mg | ORAL_TABLET | Freq: Every day | ORAL | Status: DC

## 2024-10-29 MED ORDER — CHLORHEXIDINE GLUCONATE CLOTH 2 % EX PADS
6.0000 | MEDICATED_PAD | Freq: Every day | CUTANEOUS | Status: DC
  Administered 2024-10-29: 6 via TOPICAL

## 2024-10-29 MED ORDER — GADOBUTROL 1 MMOL/ML IV SOLN
10.0000 mL | Freq: Once | INTRAVENOUS | Status: AC | PRN
  Administered 2024-10-29: 10 mL via INTRAVENOUS

## 2024-10-29 MED ORDER — MAGNESIUM SULFATE 2 GM/50ML IV SOLN
2.0000 g | Freq: Once | INTRAVENOUS | Status: AC
  Administered 2024-10-29: 2 g via INTRAVENOUS
  Filled 2024-10-29: qty 50

## 2024-10-29 MED ORDER — ALPRAZOLAM 0.25 MG PO TABS: 0.1250 mg | ORAL_TABLET | Freq: Two times a day (BID) | ORAL | Status: DC | PRN

## 2024-10-29 MED ORDER — LEVETIRACETAM 500 MG PO TABS
500.0000 mg | ORAL_TABLET | Freq: Two times a day (BID) | ORAL | Status: DC
  Administered 2024-10-29 – 2024-10-30 (×2): 500 mg via ORAL
  Filled 2024-10-29 (×2): qty 1

## 2024-10-29 MED ORDER — PANTOPRAZOLE SODIUM 40 MG PO TBEC
40.0000 mg | DELAYED_RELEASE_TABLET | Freq: Every day | ORAL | Status: DC
  Administered 2024-10-29: 40 mg via ORAL
  Filled 2024-10-29: qty 1

## 2024-10-29 NOTE — Progress Notes (Signed)
 " Assessment : POD 1 after LEFT frontoorbital approach for resection tuberculum salle meningioma. In PACU was doing well with improved vision in OU.  Plan : Sometime yesterday, she lost vision in her OD. This morning she says that it is starting to come back. OS is much better.  Motor exam and speech are perfect.  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   I would like to ambulate, DC Arterial line and will transfer her to the stepdown. I will repeat CT in AM Advance diet as tolerated. DC Drain   Social History   Socioeconomic History   Marital status: Single    Spouse name: Not on file   Number of children: 0   Years of education: Not on file   Highest education level: Not on file  Occupational History   Not on file  Tobacco Use   Smoking status: Never   Smokeless tobacco: Never  Vaping Use   Vaping status: Never Used  Substance and Sexual Activity   Alcohol use: Never   Drug use: Yes    Comment: CBD sleep gummies   Sexual activity: Not Currently  Other Topics Concern   Not on file  Social History Narrative   Not on file   Social Drivers of Health   Tobacco Use: Low Risk (10/28/2024)   Patient History    Smoking Tobacco Use: Never    Smokeless Tobacco Use: Never    Passive Exposure: Not on file  Financial Resource Strain: Not on file  Food Insecurity: No Food Insecurity (10/28/2024)   Epic    Worried About Programme Researcher, Broadcasting/film/video in the Last Year: Never true    Ran Out of Food in the Last Year: Never true  Transportation Needs: No Transportation Needs (10/28/2024)   Epic    Lack of Transportation (Medical): No    Lack of Transportation (Non-Medical): No  Physical Activity: Not on file  Stress: Not on file  Social Connections: Moderately Integrated (10/28/2024)   Social Connection and Isolation Panel    Frequency of Communication with Friends and Family: Twice a week    Frequency of Social Gatherings  with Friends and Family: Twice a week    Attends Religious Services: 1 to 4 times per year    Active Member of [employer] or Organizations: No    Attends Banker Meetings: 1 to 4 times per year    Marital Status: Never married  Intimate Partner Violence: Not At Risk (10/28/2024)   Epic    Fear of Current or Ex-Partner: No    Emotionally Abused: No    Physically Abused: No    Sexually Abused: No  Depression (PHQ2-9): Not on file  Alcohol Screen: Not on file  Housing: Low Risk (10/28/2024)   Epic    Unable to Pay for Housing in the Last Year: No    Number of Times Moved in the Last Year: 0    Homeless in the Last Year: No  Utilities: Not At Risk (10/28/2024)   Epic    Threatened with loss of utilities: No  Health Literacy: Not on file    Family History  Problem Relation Age of Onset   Diabetes Mother    Heart disease Mother        2017    2 stents    Allergies[1]  Past Medical History:  Diagnosis Date   Anxiety    Arthritis  Asthma    Depression    Dysrhythmia    Tachycardia   Hyperlipidemia    Hypertension     Past Surgical History:  Procedure Laterality Date   BREAST LUMPECTOMY Left 1976   CATARACT EXTRACTION W/ INTRAOCULAR LENS IMPLANT Bilateral 2021   CHOLECYSTECTOMY  2005   COLONOSCOPY     KNEE ARTHROPLASTY Left 12/2019   TONSILLECTOMY AND ADENOIDECTOMY  1958     Physical Exam   Physical Exam HENT:     Head: Normocephalic.     Nose: Nose normal.  Eyes:     Pupils: Pupils are equal, round, and reactive to light.  Cardiovascular:     Rate and Rhythm: Normal rate.  Pulmonary:     Effort: Pulmonary effort is normal.  Abdominal:     General: Abdomen is flat.  Musculoskeletal:     Cervical back: Normal range of motion.  Neurological:     Mental Status: Patient is alert.     Cranial Nerves: Cranial nerves 2-12 are intact except for vision OD    Sensory: Sensation is intact.     Motor: Motor function is intact.     Coordination:  Coordination is intact.     Results for orders placed or performed during the hospital encounter of 10/28/24  MR BRAIN W WO CONTRAST   Narrative   EXAM: MRI BRAIN WITH AND WITHOUT CONTRAST 10/29/2024 05:34:00 AM  TECHNIQUE: Multiplanar multisequence MRI of the head/brain was performed with and without the administration of intravenous contrast.  COMPARISON: MRI of the head dated 09/16/2024.  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 The previously demonstrated enhancing mass appears to have been resected in its entirety in the interim. There are 2 new areas of restricted diffusion present within the left mid frontal lobe and left temporo-occipital junction, which are consistent with acute infarcts. Both lesions demonstrate blooming artifact, likely secondary to petechial hemorrhage within the lesions. There are additional areas of blooming artifact along the floors of the frontal lobes bilaterally and within the left sylvian fissure, which may represent mild postoperative subarachnoid hemorrhage. There also appears to be a small subdural hemorrhage overlying the left cerebral hemisphere. No mass effect or midline shift. No hydrocephalus. Normal flow voids. No mass or abnormal enhancement. The sella is unremarkable.  ORBITS: The patient is status post bilateral lens replacement.  SINUSES: There is a moderate right mastoid air cell effusion.  BONES AND SOFT TISSUES: Normal bone marrow signal and enhancement. No acute soft tissue abnormality.  IMPRESSION: 1. Two new areas of restricted diffusion in the left mid frontal lobe and left temporo-occipital junction, consistent with acute infarcts with associated blooming artifact, likely petechial hemorrhage. 2. Additional areas of blooming artifact along the  floors of the frontal lobes bilaterally and within the left sylvian fissure, possibly representing mild postoperative subarachnoid hemorrhage. 3. Small subdural hemorrhage overlying the left cerebral hemisphere. 4. 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 The previously demonstrated enhancing mass appears to have been resected in its entirety. 5. Moderate right mastoid air cell effusion.  Electronically signed by: Evalene Coho MD MD 10/29/2024 07:25 AM EST RP Workstation: [id number]        [1]  Allergies Allergen Reactions   Coconut Flavoring Agent (Non-Screening) Anaphylaxis    food   Coconut (Cocos Nucifera)    "

## 2024-10-29 NOTE — Progress Notes (Signed)
  Inpatient Rehab Admissions Coordinator :  Per therapy recommendations, patient was screened for CIR candidacy by Ottie Glazier RN MSN.  At this time patient appears to be a potential candidate for CIR. I will place a rehab consult per protocol for full assessment. Please call me with any questions.  Ottie Glazier RN MSN Admissions Coordinator [phone]

## 2024-10-29 NOTE — Plan of Care (Signed)
   Problem: Education: Goal: Knowledge of General Education information will improve Description Including pain rating scale, medication(s)/side effects and non-pharmacologic comfort measures Outcome: Progressing   Problem: Health Behavior/Discharge Planning: Goal: Ability to manage health-related needs will improve Outcome: Progressing

## 2024-10-29 NOTE — Progress Notes (Signed)
" ° °  NAME:  Emily Cruz, MRN:  [id number], DOB:  19-Jun-1951, LOS: 1 ADMISSION DATE:  10/28/2024, CONSULTATION DATE:  1/12 REFERRING MD:  Dr. Rosslyn, CHIEF COMPLAINT:  meningioma s/p resection   History of Present Illness:  Patient is a 74 yo F w/ pertinent PMH meningioma presenting to [facility] on 1/12 for elective resection.  Patient developed progressive vision loss left eye worse than right eye, back in 08/2024. Seen by NSG. 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 Decision made for elective surgery.  Patient arrived to [facility] on 1/12 for elective surgery. S/p crani and resection. Post op brought to neuro icu. Pccm consulted.  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 rate (!) 22, height 5' 3 (1.6 m), weight 99.5 kg, SpO2 93%.        Intake/Output Summary (Last 24 hours) at 10/29/2024 0840 Last data filed at 10/29/2024 9384 Gross per 24 hour  Intake 1979.81 ml  Output 3005 ml  Net -1025.19 ml   Filed Weights   10/28/24 0553 10/28/24 9392  Weight: 97.5 kg 99.5 kg    Examination: General:   NAD HEENT: MM pink/moist; Simpson in place; staples in place Neuro: Aox3; MAE; some right vision loss; left vision slightly improved CV: s1s2, RRR, no m/r/g PULM:  dim clear BS bilaterally; Pala  GI: soft, bsx4 active  Extremities: warm/dry, no edema  Skin: no rashes or lesions    Resolved problem list    Assessment and Plan   Meningioma s/p crani and tumor resection Plan: -pain management -drain removed; stop ancef  -Decadron  -prn labetalol  SBP <160; cont po anti-htn meds -per nsg [facility] tomorrow am -keppra  for seizure ppx -neuro checks  Hx of anxiety/depression Plan: -resume home xanax  prn  -hold wellbutrin for now given on keppra  for seizure ppx  Hx of HTN/HLD Plan: -statin -prn iv labetalol  for sbp as above; cont home propanolol and verapamil ; added losartan  yesterday  Peripheral neuropathy Plan: -gabapentin   GERD Plan: -protonix    Critical care time: na     RODGER Emilio RIGGERS [facility] 10/29/2024, 8:40 AM  Please see [email or web address] for pager details.  From 7A-7P if no response, please call [phone]. After hours, please call ELink [phone].        "

## 2024-10-29 NOTE — Evaluation (Signed)
 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 PMH: anxiety, arthritis, asthma, depression, HLD, HTN, L TKA   Clinical Impression  Pt admitted with above. PTA pt lived with a friend and was indep without AD. Pt now presenting with impaired vision, impaired memory, decreased safety awareness, impaired balance, and requires minA and use of RW for safe mobility. Pt to benefit from intensive inpatient rehab program > 3 hrs a day to address above deficits and learn to function with low level vision for safe transition home with friend. Acute PT to cont to follow.        If plan is discharge home, recommend the following: A little help with walking and/or transfers;A little help with bathing/dressing/bathroom;Direct supervision/assist for medications management;Assist for transportation;Help with stairs or ramp for entrance;Supervision due to cognitive status   Can travel by private vehicle        Equipment Recommendations None recommended by PT  Recommendations for Other Services  Rehab consult    Functional Status Assessment Patient has had a recent decline in their functional status and demonstrates the ability to make significant improvements in function in a reasonable and predictable amount of time.     Precautions / Restrictions Precautions Precautions: Fall Recall of Precautions/Restrictions: Impaired Precaution/Restrictions Comments: pt with impaired vision Restrictions Weight Bearing Restrictions Per Provider Order: No      Mobility  Bed Mobility               General bed mobility comments: pt received in bathroom with RN and returned to chair    Transfers Overall transfer level: Needs  assistance Equipment used: Rolling walker (2 wheels) Transfers: Sit to/from Stand Sit to Stand: Min assist           General transfer comment: minA due to impaired vision, minA to power up and to place hands on RW    Ambulation/Gait Ambulation/Gait assistance: Min assist Gait Distance (Feet): 100 Feet Assistive device: Rolling walker (2 wheels) Gait Pattern/deviations: Step-through pattern, Decreased stride length, Wide base of support, Trunk flexed Gait velocity: dec due to impaired vision Gait velocity interpretation: 1.31 - 2.62 ft/sec, indicative of limited community ambulator   General Gait Details: pt with impaired vision requiring min assist for walker management to navigate hallways and around room. , wide base of support with increased UE support  Stairs            Wheelchair Mobility     Tilt Bed    Modified Rankin (Stroke Patients Only) Modified Rankin (Stroke Patients Only) Pre-Morbid Rankin Score: Slight disability Modified Rankin: Moderately severe disability     Balance Overall balance assessment: Needs assistance Sitting-balance support: Feet supported, No upper extremity supported Sitting balance-Leahy Scale: Fair     Standing balance support: Single extremity supported, During functional activity Standing balance-Leahy Scale: Fair Standing balance comment: reliant on UE support due to impaired vision                             Pertinent Vitals/Pain Pain Assessment Pain Assessment: No/denies pain    Home Living Family/patient expects to be discharged to:: Private residence Living Arrangements: Non-relatives/Friends Available Help at Discharge: Friend(s);Available PRN/intermittently Type of Home: House Home  Access: Stairs to enter Entrance Stairs-Rails: Right;Left;Can reach both Entrance Stairs-Number of Steps: 4   Home Layout: One level Home Equipment: Agricultural Consultant (2 wheels);Cane - single point;Shower seat       Prior Function Prior Level of Function : Independent/Modified Independent;Driving             Mobility Comments: no AD, prefers to stay at home, driving ADLs Comments: indep     Extremity/Trunk Assessment   Upper Extremity Assessment Upper Extremity Assessment: Generalized weakness    Lower Extremity Assessment Lower Extremity Assessment: Generalized weakness    Cervical / Trunk Assessment Cervical / Trunk Assessment: Other exceptions Cervical / Trunk Exceptions: s/p crani  Communication   Communication Communication: Impaired Factors Affecting Communication: Reduced clarity of speech;Difficulty expressing self (word finding difficulty)    Cognition Arousal: Alert Behavior During Therapy: WFL for tasks assessed/performed   PT - Cognitive impairments: Memory, Awareness, Attention, Problem solving, Safety/Judgement, Orientation   Orientation impairments: Time                   PT - Cognition Comments: pt re-oriented to date x 4 in 20 min and pt unable to recall. Pt with decreased insight to safety as she felt it was safe for her to get up and go to the bathroom without help despite not being able to see and being hooked up to multiple lines, pt frequently saying I keep forgetting I had brain surgery yesterday Following commands: Impaired Following commands impaired: Follows one step commands with increased time     Cueing Cueing Techniques: Verbal cues, Tactile cues     General Comments General comments (skin integrity, edema, etc.): VSS, frontal cranial incision with staples, pt received on commode, minA for hygiene    Exercises     Assessment/Plan    PT Assessment Patient needs continued PT services  PT Problem List Decreased strength;Decreased range of motion;Decreased activity tolerance;Decreased balance;Decreased mobility;Decreased coordination;Decreased cognition;Decreased knowledge of use of DME;Decreased safety awareness       PT Treatment  Interventions DME instruction;Gait training;Stair training;Functional mobility training;Therapeutic activities;Therapeutic exercise;Balance training;Neuromuscular re-education;Cognitive remediation    PT Goals (Current goals can be found in the Care Plan section)  Acute Rehab PT Goals Patient Stated Goal: home PT Goal Formulation: With patient Time For Goal Achievement: 11/12/24 Potential to Achieve Goals: Good    Frequency Min 4X/week     Co-evaluation               AM-PAC PT 6 Clicks Mobility  Outcome Measure Help needed turning from your back to your side while in a flat bed without using bedrails?: A Little Help needed moving from lying on your back to sitting on the side of a flat bed without using bedrails?: A Little Help needed moving to and from a bed to a chair (including a wheelchair)?: A Little Help needed standing up from a chair using your arms (e.g., wheelchair or bedside chair)?: A Little Help needed to walk in hospital room?: A Little Help needed climbing 3-5 steps with a railing? : A Lot 6 Click Score: 17    End of Session Equipment Utilized During Treatment: Gait belt Activity Tolerance: Patient tolerated treatment well Patient left: in chair;with call bell/phone within reach;with chair alarm set;with family/visitor present Nurse Communication: Mobility status PT Visit Diagnosis: Unsteadiness on feet (R26.81);Muscle weakness (generalized) (M62.81);Difficulty in walking, not elsewhere classified (R26.2)    Time: 8697-8671 PT Time Calculation (min) (ACUTE ONLY): 26 min   Charges:  PT Evaluation $PT Eval Moderate Complexity: 1 Mod   PT General Charges $$ ACUTE PT VISIT: 1 Visit         Norene Ames, PT, DPT Acute Rehabilitation Services Secure chat preferred Office #: [phone]   Norene CHRISTELLA Ames 10/29/2024, 2:04 PM

## 2024-10-30 ENCOUNTER — Inpatient Hospital Stay (HOSPITAL_COMMUNITY)

## 2024-10-30 LAB — SURGICAL PATHOLOGY

## 2024-10-30 MED ORDER — METHYLPREDNISOLONE 4 MG PO TBPK: 8.0000 mg | ORAL_TABLET | Freq: Every evening | ORAL | Status: DC

## 2024-10-30 MED ORDER — METHYLPREDNISOLONE 4 MG PO TBPK: ORAL_TABLET | ORAL | 0 refills | Status: AC

## 2024-10-30 MED ORDER — LEVETIRACETAM 500 MG PO TABS: 500.0000 mg | ORAL_TABLET | Freq: Two times a day (BID) | ORAL | Status: DC

## 2024-10-30 MED ORDER — METHYLPREDNISOLONE 4 MG PO TBPK: 4.0000 mg | ORAL_TABLET | ORAL | Status: DC

## 2024-10-30 MED ORDER — METHYLPREDNISOLONE 4 MG PO TBPK: 4.0000 mg | ORAL_TABLET | Freq: Four times a day (QID) | ORAL | Status: DC

## 2024-10-30 MED ORDER — METHYLPREDNISOLONE 4 MG PO TBPK
8.0000 mg | ORAL_TABLET | Freq: Every morning | ORAL | Status: DC
  Filled 2024-10-30: qty 21

## 2024-10-30 MED ORDER — METHYLPREDNISOLONE 4 MG PO TBPK: 4.0000 mg | ORAL_TABLET | Freq: Three times a day (TID) | ORAL | Status: DC

## 2024-10-30 MED ORDER — LEVETIRACETAM 500 MG PO TABS: 500.0000 mg | ORAL_TABLET | Freq: Two times a day (BID) | ORAL | 0 refills | Status: AC

## 2024-10-30 NOTE — Discharge Instructions (Signed)
 Continue home medications. Tylenol  for pain  You are on a Medrol  Dosepak. This is a steroid medication that decreases the swelling inside your head. You need to take it exactly as prescribed: *This is a Tapered Medication.* Day 1: 24 mg (6 tablets) - 2 before breakfast, 1 at lunch, 1 at dinner, and 2 at bedtime Day 2: 20 mg (5 tablets) - 1 before breakfast, 1 after lunch, 1 after dinner, 2 at bedtime  Day 3: 16 mg (4 tablets) - 1 before breakfast, 1 after lunch, 1 after dinner, 1 at bedtime  Day 4: 12 mg (3 tablets) - 1 before breakfast, 1 after lunch, 1 at bedtime  Day 5: 8 mg (2 tablets) - 1 before breakfast, 1 at bedtime  Day 6: 4 mg (1 tablet) - 1 before breakfast Day 7: You should no longer have any tablets  We also prescribed Keppra  500mg  tablets to be taken twice daily. Please take these exactly as prescribed for 5 days. This medication helps to prevent any possible seizure like activity as the brian heals.   Take constipation medications so that you are not straining, some combination of over-the-counter miralax, senna, prune juice, or whatever works for you at home.  No showering, washing hair, or getting incision wet until you see Dr. Rosslyn in clinic. Keep the incision site out of direct sunlight, this is very important.   Not uncommon to have swelling to the side of the surgery; stay upright during day if able, can use a wrapped ice pack for 10-15 minutes at a time  Monitor incision site drainage, notify Dr. Catarino office if increasing drainage or looks different  If any tissue samples were taken, Dr. Rosslyn will discuss results with you in clinic  Mobilize as able, this is very important for overall recovery

## 2024-10-30 NOTE — Progress Notes (Signed)
 Inpatient Rehab Coordinator Note:  I met with patient at bedside during her OT assessment to discuss CIR recommendations and goals/expectations of CIR stay.  We reviewed 3 hrs/day of therapy, physician follow up, estimated length of stay 5-8 days, with goals of modified independence.  During OT assessment pt mobilized with little physical assist and no AD, ambulated with observed impairments to balance with delayed protective responses, significant visual impairments, and very poor safety awareness.  OT and I discussed difficult to prognosticate speed of recovery given recent surgery and acute infarcts and pt verbalized understanding.  Pt does have 24/7 supervision from her room mate who is retired CHIEF EXECUTIVE OFFICER and Marval confirms she can provide 24/7 supervision.  With 24/7 I think Diella will be okay to discharge home, so at this time we will not pursue CIR.  We will remain available if things change.    Reche Lowers, PT, DPT Admissions Coordinator [phone] 10/30/2024 12:07 PM

## 2024-10-30 NOTE — Progress Notes (Signed)
 Discharge instructions given. IV removed without issue. Personal belongings returned to patient. Patient transported off unit in wheelchair by NT. Patient departed campus in personal vehicle driven by friend.

## 2024-10-30 NOTE — Discharge Summary (Signed)
 "  Physician Discharge Summary   Patient: Emily Cruz MRN: [id number] DOB: 20-Jun-1951  Admit date:     10/28/2024  Discharge date: 10/30/2024  Discharge Physician: Jayson JINNY Eis   PCP: Kip Righter, MD   Recommendations at discharge:    Discharge Instructions      Continue home medications. Tylenol  for pain  You are on a Medrol  Dosepak. This is a steroid medication that decreases the swelling inside your head. You need to take it exactly as prescribed: *This is a Tapered Medication.* Day 1: 24 mg (6 tablets) - 2 before breakfast, 1 at lunch, 1 at dinner, and 2 at bedtime Day 2: 20 mg (5 tablets) - 1 before breakfast, 1 after lunch, 1 after dinner, 2 at bedtime  Day 3: 16 mg (4 tablets) - 1 before breakfast, 1 after lunch, 1 after dinner, 1 at bedtime  Day 4: 12 mg (3 tablets) - 1 before breakfast, 1 after lunch, 1 at bedtime  Day 5: 8 mg (2 tablets) - 1 before breakfast, 1 at bedtime  Day 6: 4 mg (1 tablet) - 1 before breakfast Day 7: You should no longer have any tablets  We also prescribed Keppra  500mg  tablets to be taken twice daily. Please take these exactly as prescribed for 5 days. This medication helps to prevent any possible seizure like activity as the brian heals.   Take constipation medications so that you are not straining, some combination of over-the-counter miralax, senna, prune juice, or whatever works for you at home.  No showering, washing hair, or getting incision wet until you see Dr. Rosslyn in clinic. Keep the incision site out of direct sunlight, this is very important.   Not uncommon to have swelling to the side of the surgery; stay upright during day if able, can use a wrapped ice pack for 10-15 minutes at a time  Monitor incision site drainage, notify Dr. Catarino office if increasing drainage or looks different  If any tissue samples were taken, Dr. Rosslyn will discuss results with you in clinic  Mobilize as able, this is very important for  overall recovery    Discharge Diagnoses: Principal Problem:   Meningioma [facility])  Resolved Problems:   * No resolved hospital problems. [facility] Course: Patient admitted on 10/28/2024  for Left frontoorbital resection of tuberculum salle meningioma. Operation was uncomplicated. Postop scan and exam showed no complications. Ambulating, pain controlled, tolerating diet, and voiding at time of discharge. Will follow up in [facility] Neurosurgery Team clinic with instructions provided as above. Post-hospitalization questions to be answered by [facility] Neurosurgery team.  Disposition: Home  DISCHARGE MEDICATION: Allergies as of 10/30/2024       Reactions   Coconut Flavoring Agent (non-screening) Anaphylaxis   food   Coconut (cocos Nucifera)         Medication List     TAKE these medications    albuterol  108 (90 Base) MCG/ACT inhaler Commonly known as: VENTOLIN  HFA Inhale 2 puffs into the lungs every 6 (six) hours as needed for wheezing or shortness of breath.   ALPRAZolam  0.25 MG tablet Commonly known as: XANAX  Take 0.125-0.25 mg by mouth 2 (two) times daily as needed for anxiety.   aspirin EC 81 MG tablet Take 81 mg by mouth in the morning. Swallow whole.   buPROPion 100 MG tablet Commonly known as: WELLBUTRIN Take 100 mg by mouth 2 (two) times daily.   CANNABIDIOL PO Take 0.5-1 each by mouth at bedtime as needed (  sleep). CBD Gummies   gabapentin  100 MG capsule Commonly known as: NEURONTIN  Take 100 mg by mouth in the morning, at noon, and at bedtime.   ipratropium 0.03 % nasal spray Commonly known as: ATROVENT Place 2 sprays into both nostrils 2 (two) times daily.   levETIRAcetam  500 MG tablet Commonly known as: Keppra  Take 1 tablet (500 mg total) by mouth 2 (two) times daily.   methylPREDNISolone  4 MG Tbpk tablet Commonly known as: MEDROL  DOSEPAK This is a tapered medication and you must follow these instructions exactly as prescribed. On day 1 take 24  milligrams total by taking 2 tablets before breakfast, 1 tablet at lunch, 1 tablet at dinner, and 2 tablets at bedtime. On day 2 take 20 milligrams total by taking 1 tablet before breakfast, 1 tablet after lunch, 1 tablet after dinner, and 2 tablets at bedtime. On day 3 take 16 milligrams total by taking 1 tablet before breakfast, 1 tablet after lunch, 1 tablet after dinner, and 1 tablet at bedtime. On day 4 take 12 milligrams total by taking 1 tablet before breakfast, 1 tablet after lunch, and 1 tablet at bedtime. On day 5 take 8 milligrams total by taking 1 tablet before breakfast and 1 tablet at bedtime. On day 6 take 4 milligrams total by taking 1 tablet before breakfast. On day 7 you should no longer have any tablets.   pantoprazole  40 MG tablet Commonly known as: PROTONIX  Take 40 mg by mouth in the morning.   pravastatin  20 MG tablet Commonly known as: PRAVACHOL  Take 20 mg by mouth every evening.   propranolol  ER 80 MG 24 hr capsule Commonly known as: INDERAL  LA TAKE 1 CAPSULE BY MOUTH EVERY DAY What changed:  how much to take when to take this   verapamil  240 MG CR tablet Commonly known as: CALAN -SR Take 1 tablet (240 mg total) by mouth daily. What changed: when to take this               Discharge Care Instructions  (From admission, onward)           Start     Ordered   10/30/24 0000  No dressing needed       Comments: No showering, washing hair, or getting incision wet until you see Dr. Rosslyn in clinic. Keep the incision site out of direct sunlight, this is very important.   Not uncommon to have swelling to the side of the surgery; stay upright during day if able, can use a wrapped ice pack for 10-15 minutes at a time  Monitor incision site drainage, notify Dr. Catarino office if increasing drainage or looks different   10/30/24 1150             Discharge Exam: No distress Incision site looks CDI Abd soft Lungs clear Heart sounds regular Improved  vision Ambulating and moving all 4 ext  Condition at discharge: Stable  Time spent on discharge: 30 minutes  "

## 2024-10-30 NOTE — TOC Initial Note (Incomplete)
 Transition of Care [facility]) - Initial/Assessment Note    Patient Details  Name: Emily Cruz MRN: [id number] Date of Birth: 1951/06/03  Transition of Care [facility]) CM/SW Contact:    Josepha Mliss HERO, RN Phone Number: 10/30/2024, 11:50 AM  Clinical Narrative:                         Patient Goals and CMS Choice            Expected Discharge Plan and Services                                              Prior Living Arrangements/Services                       Activities of Daily Living      Permission Sought/Granted                  Emotional Assessment              Admission diagnosis:  Meningioma [facility]) [D32.9] Patient Active Problem List   Diagnosis Date Noted   Meningioma (HCC) 10/28/2024   Adrenal mass 08/28/2024   Allergic rhinitis 08/28/2024   Asthma 08/28/2024   Chronic pain 08/28/2024   Diverticular disease of colon 08/28/2024   Gastroesophageal reflux disease 08/28/2024   Goiter 08/28/2024   Hyperglycemia 08/28/2024   Hyperlipidemia 08/28/2024   Essential hypertension 08/28/2024   Insomnia 08/28/2024   Morbid obesity (HCC) 08/28/2024   Recurrent major depression in full remission 08/28/2024   Restless legs 08/28/2024   Vitamin D deficiency 08/28/2024   Closed fracture of proximal phalanx of little finger 07/25/2024   Bilateral sensorineural hearing loss 01/27/2023   Pressure sensation in right ear 01/27/2023   Tinnitus of both ears 01/27/2023   Degeneration of intervertebral disc of cervical spine without prolapsed disc 12/15/2021   Anxiety 12/15/2021   Depressive disorder 12/15/2021   Hypertensive disorder 12/15/2021   Combined form of age-related cataract, left eye 11/07/2018   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, high risk, bilateral 11/07/2018   PCP:  Kip Righter, MD Pharmacy:   [facility] [phone] - [place], [place] - [address] AT  [facility] [address] [place] [address] Phone: [phone] Fax: [phone]     Social Drivers of Health (SDOH) Social History: SDOH Screenings   Food Insecurity: No Food Insecurity (10/28/2024)  Housing: Low Risk (10/28/2024)  Transportation Needs: No Transportation Needs (10/28/2024)  Utilities: Not At Risk (10/28/2024)  Social Connections: Moderately Integrated (10/28/2024)  Tobacco Use: Low Risk (10/28/2024)   SDOH Interventions:     Readmission Risk Interventions     No data to display

## 2024-10-30 NOTE — Progress Notes (Signed)
 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 Post surgery MRI revealed wo new areas of restricted diffusion in the left mid frontal lobe and left  temporo-occipital junction, consistent with acute infarcts with associated  blooming artifact, likely petechial hemorrhage.  2. Additional areas of blooming artifact along the floors of the frontal lobes  bilaterally and within the left sylvian fissure, possibly representing mild  postoperative subarachnoid hemorrhage.  3. Small subdural hemorrhage overlying the left cerebral hemisphere.  PMH: anxiety, arthritis, asthma, depression, HLD, HTN, L TKA     Clinical Impressions Pt admitted with above. She demonstrates the below listed deficits and will benefit from continued HHOT, SLP, PT to maximize safety and independence with BADLs.  She demonstrates deficits with vision/perception, awareness, memory, problem solving and reasoning, safety, ADLs and functional mobility.   PTA, pt lived with her roommate and was fully independent PTA.  Discussed recommendation for 24 hour supervision with pt, friend and roommate, as well as safety strategies for home.  All further OT needs can be addressed in next venue of care      If plan is discharge home, recommend the following:   A little help with walking and/or transfers;A little help with bathing/dressing/bathroom;Assistance with cooking/housework;Direct supervision/assist for medications management;Direct supervision/assist for financial management;Help with stairs or ramp for entrance;Supervision due to cognitive status;Assist for transportation     Functional Status Assessment   Patient has had a recent decline in their functional status and demonstrates the ability to make  significant improvements in function in a reasonable and predictable amount of time.     Equipment Recommendations   None recommended by OT     Recommendations for Other Services         Precautions/Restrictions   Precautions Precautions: Fall Recall of Precautions/Restrictions: Impaired Precaution/Restrictions Comments: Pt with significant vision deficits Restrictions Weight Bearing Restrictions Per Provider Order: No     Mobility Bed Mobility Overal bed mobility: Independent                  Transfers Overall transfer level: Needs assistance Equipment used: None Transfers: Sit to/from Stand Sit to Stand: Supervision           General transfer comment: able to ambulate on unit with CGA for safety due to vision deficits      Balance Overall balance assessment: Needs assistance Sitting-balance support: Feet supported, No upper extremity supported Sitting balance-Leahy Scale: Good     Standing balance support: No upper extremity supported Standing balance-Leahy Scale: Good                             ADL either performed or assessed with clinical judgement   ADL Overall ADL's : Needs assistance/impaired Eating/Feeding: Supervision/ safety;Set up   Grooming: Wash/dry hands;Wash/dry face;Oral care;Supervision/safety (verbal cues for thoroughness)   Upper Body Bathing: Supervision/ safety   Lower Body Bathing: Supervison/ safety   Upper Body Dressing : Supervision/safety   Lower Body Dressing: Supervision/safety   Toilet Transfer: Supervision/safety   Toileting- Clothing Manipulation and Hygiene: Ecologist Details (indicate cue type and reason): pt will sponge bathe until she is able to get incision wet ~ 10 days Functional mobility during ADLs: Contact guard assist  Vision Baseline Vision/History:  (Pt reports she had visual deficits with OU PTA) Ability to See in Adequate Light: 0  Adequate Patient Visual Report: Central vision impairment Vision Assessment?: Yes Eye Alignment: Within Functional Limits Ocular Range of Motion: Within Functional Limits Alignment/Gaze Preference: Gaze left;Gaze right Visual Fields: Right visual field deficit Depth Perception: Undershoots Additional Comments: Pt with impaired acuity OS, but able to make out images and shapes - requires cues to scan dynamic environments; OD with impaired visual field:  OU able to target items on Lt and Rt with min cues.  Unable to read.  Able to negotiate dynamic environment with Supervision     Perception Perception: Impaired Preception Impairment Details: Spatial orientation     Praxis Praxis: [facility]       Pertinent Vitals/Pain Pain Assessment Pain Assessment: No/denies pain     Extremity/Trunk Assessment Upper Extremity Assessment Upper Extremity Assessment: Overall WFL for tasks assessed   Lower Extremity Assessment Lower Extremity Assessment: Generalized weakness       Communication Communication Communication: Impaired Factors Affecting Communication: Difficulty expressing self   Cognition Arousal: Alert Behavior During Therapy: Impulsive Cognition: Cognition impaired   Orientation impairments: Person, Place, Time, Situation Awareness: Intellectual awareness impaired, Online awareness impaired Memory impairment (select all impairments): Short-term memory, Working civil service fast streamer, Conservation officer, historic buildings Attention impairment (select first level of impairment): Selective attention Executive functioning impairment (select all impairments): Organization, Reasoning, Problem solving OT - Cognition Comments: The Short Blessed Test was administered with pt scoring 6/28 which is indicative of Questionable deficit.  Functionally pt demonstrated deficits with the above areas.                         Cueing  General Comments   Cueing Techniques: Verbal cues  Long discussion with pt,  friend re: current deficits, need for 24/7 supervision, no showering, direct assist when outside, no cooking or handling sharp objects such as knives, need for direct supervision with medications and no driving.  They verbalized understanding.  Discussed also with pt roommate   Exercises     Shoulder Instructions      Home Living Family/patient expects to be discharged to:: Private residence Living Arrangements: Non-relatives/Friends Available Help at Discharge: Friend(s);Available PRN/intermittently Type of Home: House Home Access: Stairs to enter [employer] of Steps: 4 Entrance Stairs-Rails: Right;Left;Can reach both Home Layout: One level     Bathroom Shower/Tub: Producer, Television/film/video: Standard     Home Equipment: Agricultural Consultant (2 wheels);Cane - single point;Shower seat   Additional Comments: Pt's roommat4e Marval, is a retired Advertising Account Executive and works 2 days/week for a art therapist.  Pt has a friend who is staying with her until mid next week.  Debbie confirmed patient will have 24/7 supervision/assist after friend leaves town      Prior Functioning/Environment Prior Level of Function : Independent/Modified Independent;Driving             Mobility Comments: Pt reports she drives and was fully independent PTA. ADLs Comments: indep    OT Problem List: Impaired vision/perception;Decreased cognition;Decreased safety awareness;Decreased knowledge of precautions   OT Treatment/Interventions:        OT Goals(Current goals can be found in the care plan section)   Acute Rehab OT Goals OT Goal Formulation: All assessment and education complete, DC therapy   OT Frequency:       Co-evaluation  AM-PAC OT 6 Clicks Daily Activity     Outcome Measure Help from another person eating meals?: A Little Help from another person taking care of personal grooming?: A Little Help from another person toileting, which includes using  toliet, bedpan, or urinal?: A Little Help from another person bathing (including washing, rinsing, drying)?: A Little Help from another person to put on and taking off regular upper body clothing?: A Little Help from another person to put on and taking off regular lower body clothing?: A Little 6 Click Score: 18   End of Session Equipment Utilized During Treatment: Gait belt Nurse Communication: Mobility status  Activity Tolerance: Patient tolerated treatment well Patient left: in bed;with call bell/phone within reach;with family/visitor present;with nursing/sitter in room  OT Visit Diagnosis: Low vision, both eyes (H54.2);Cognitive communication deficit (R41.841)                Time: 8943-8856 OT Time Calculation (min): 47 min Charges:  OT General Charges $OT Visit: 1 Visit OT Evaluation $OT Eval Moderate Complexity: 1 Mod OT Treatments $Self Care/Home Management : 8-22 mins $Therapeutic Activity: 8-22 mins  Angeline BROCKS., OTR/L Acute Rehabilitation Services Pager [phone] Office [phone]   Angeline CHRISTELLA Gallery 10/30/2024, 1:04 PM

## 2024-10-30 NOTE — Progress Notes (Signed)
 Pt is off the unit to CT

## 2024-10-30 NOTE — Progress Notes (Signed)
 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 Post surgery MRI revealed wo new areas of restricted diffusion in the left mid frontal lobe and left  temporo-occipital junction, consistent with acute infarcts with associated  blooming artifact, likely petechial hemorrhage.  2. Additional areas of blooming artifact along the floors of the frontal lobes  bilaterally and within the left sylvian fissure, possibly representing mild  postoperative subarachnoid hemorrhage.  3. Small subdural hemorrhage overlying the left cerebral hemisphere.  PMH: anxiety, arthritis, asthma, depression, HLD, HTN, L TKA    PT Comments  Pt slowly progressing towards all goals. Pt continues with impaired vision, impaired processing, decreased insight to deficits and safety, impaired balance, and requires 24/7 assist for safe function at home. After long discussion with patient and roommates about safety while pt with low vision and the importance of attending therapy sessions to learn compensatory strategies for low vision function. Acute PT to cont to follow.    If plan is discharge home, recommend the following: A little help with walking and/or transfers;A little help with bathing/dressing/bathroom;Direct supervision/assist for medications management;Assist for transportation;Help with stairs or ramp for entrance;Supervision due to cognitive status   Can travel by private vehicle        Equipment Recommendations  None recommended by PT    Recommendations for Other Services Rehab consult     Precautions / Restrictions Precautions Precautions: Fall Recall of Precautions/Restrictions: Impaired Precaution/Restrictions Comments: Pt with significant vision deficits Restrictions Weight Bearing  Restrictions Per Provider Order: No     Mobility  Bed Mobility Overal bed mobility: Independent             General bed mobility comments: HOB elevated, no physical assist    Transfers Overall transfer level: Needs assistance Equipment used: None Transfers: Sit to/from Stand Sit to Stand: Contact guard assist           General transfer comment: contact guard for safety due to vision deficits    Ambulation/Gait Ambulation/Gait assistance: Contact guard assist Gait Distance (Feet): 200 Feet Assistive device: None Gait Pattern/deviations: Step-through pattern, Narrow base of support, Staggering left, Staggering right (near cross over gait at times) Gait velocity: dec due to impaired vision Gait velocity interpretation: 1.31 - 2.62 ft/sec, indicative of limited community ambulator   General Gait Details: pt unsteady with vearing L/R, verbal cues to aide in navigating around obstacles. Pt with impaired depth perception and vision   Stairs Stairs: Yes Stairs assistance: Contact guard assist Stair Management: One rail Left, Alternating pattern, Forwards Number of Stairs: 8 General stair comments: pt impulsively fast but able to navigate   Wheelchair Mobility     Tilt Bed    Modified Rankin (Stroke Patients Only) Modified Rankin (Stroke Patients Only) Pre-Morbid Rankin Score: Slight disability Modified Rankin: Moderately severe disability     Balance Overall balance assessment: Needs assistance Sitting-balance support: Feet supported, No upper extremity supported Sitting balance-Leahy Scale: Good     Standing balance support: No upper extremity supported Standing balance-Leahy Scale: Fair                              Musician Communication: Impaired Factors Affecting Communication: Difficulty expressing self  Cognition Arousal: Alert Behavior During Therapy: Impulsive   PT -  Cognitive impairments: Memory, Awareness,  Attention, Problem solving, Safety/Judgement, Orientation   Orientation impairments: Time                   PT - Cognition Comments: pt with decreased insight of degree of vision deficits Following commands: Impaired Following commands impaired: Follows one step commands with increased time    Cueing Cueing Techniques: Verbal cues  Exercises      General Comments General comments (skin integrity, edema, etc.): VSS, further educated pt and friends on importance of 24/7 assist until vision improves      Pertinent Vitals/Pain Pain Assessment Pain Assessment: No/denies pain    Home Living Family/patient expects to be discharged to:: Private residence Living Arrangements: Non-relatives/Friends Available Help at Discharge: Friend(s);Available PRN/intermittently Type of Home: House Home Access: Stairs to enter Entrance Stairs-Rails: Right;Left;Can reach both Entrance Stairs-Number of Steps: 4   Home Layout: One level Home Equipment: Agricultural Consultant (2 wheels);Cane - single point;Shower seat Additional Comments: Pt's roommat4e Marval, is a retired Advertising Account Executive and works 2 days/week for a art therapist.  Pt has a friend who is staying with her until mid next week.  Debbie confirmed patient will have 24/7 supervision/assist after friend leaves town    Prior Function            PT Goals (current goals can now be found in the care plan section) Acute Rehab PT Goals Patient Stated Goal: home PT Goal Formulation: With patient Time For Goal Achievement: 11/12/24 Potential to Achieve Goals: Good Progress towards PT goals: Progressing toward goals    Frequency    Min 4X/week      PT Plan      Co-evaluation              AM-PAC PT 6 Clicks Mobility   Outcome Measure  Help needed turning from your back to your side while in a flat bed without using bedrails?: A Little Help needed moving from lying on your back to sitting on the side of a flat bed without  using bedrails?: A Little Help needed moving to and from a bed to a chair (including a wheelchair)?: A Little Help needed standing up from a chair using your arms (e.g., wheelchair or bedside chair)?: A Little Help needed to walk in hospital room?: A Little Help needed climbing 3-5 steps with a railing? : A Little 6 Click Score: 18    End of Session Equipment Utilized During Treatment: Gait belt Activity Tolerance: Patient tolerated treatment well Patient left: in chair;with call bell/phone within reach;with chair alarm set;with family/visitor present Nurse Communication: Mobility status PT Visit Diagnosis: Unsteadiness on feet (R26.81);Muscle weakness (generalized) (M62.81);Difficulty in walking, not elsewhere classified (R26.2)     Time: 8756-8746 PT Time Calculation (min) (ACUTE ONLY): 10 min  Charges:    $Gait Training: 8-22 mins PT General Charges $$ ACUTE PT VISIT: 1 Visit                     Norene Ames, PT, DPT Acute Rehabilitation Services Secure chat preferred Office #: [phone]    Norene CHRISTELLA Ames 10/30/2024, 2:07 PM

## 2024-10-30 NOTE — TOC Transition Note (Addendum)
 Transition of Care (TOC) - Discharge Note Rayfield Gobble RN,BSN Inpatient Care Management Unit 4NP (Non Trauma)- RN Case Manager See Treatment Team for direct Phone #   Patient Details  Name: Emily Cruz MRN: [id number] Date of Birth: 10-20-1950  Transition of Care [facility]) CM/SW Contact:  [name] Hurst, RN Phone Number: 10/30/2024, 12:55 PM   Clinical Narrative:    Pt stable for transition home, CIR has assessed pt this am as well as PT/OT- pt has voiced she prefers to return home, has support 24/7 from her roommate.  Orders placed for HHPT/OT/SLP  CM in to speak with pt at bedside, discussed HH and list provided for choice- Per CMS guidelines from phonefinancing.pl website with star ratings (copy placed in shadow chart)- pt voiced she would like to use [facility] for services.  Pt also voiced that she is not sure she will need HH- CM encouraged pt to let HH come out and assess in her home environment- she can always cancel if she no longer wants/needs services- pt states she is agreeable to [facility] referral.  Pt reports she has needed DME at home RW, shower chair)- no new needs at this time  Address, phone # and PCP all confirmed- pt does state that she has never seen Dr. Kip- but does go to that practice and sees Anthony Hint- NP.   Friend at bedside to transport home.   Referral made to [facility] in [place], confirmed with liaison that they can provide ordered services- referral has been accepted.   IP CM interventions have been completed no further needs noted.   Final next level of care: Home w Home Health Services Barriers to Discharge: Barriers Resolved   Patient Goals and CMS Choice Patient states their goals for this hospitalization and ongoing recovery are:: return home CMS Medicare.gov Compare Post Acute Care list provided to:: Patient Choice offered to / list presented to : Patient      Discharge Placement               Home w/ [facility]        Discharge  Plan and Services Additional resources added to the After Visit Summary for   In-house Referral: NA Discharge Planning Services: CM Consult Post Acute Care Choice: Home Health          DME Arranged: N/A DME Agency: NA       HH Arranged: PT, OT, Speech Therapy HH Agency: [facility] Date HH Agency Contacted: 10/30/24 Time HH Agency Contacted: 1245 Representative spoke with at [facility] Agency: [place]  Social Drivers of Health (SDOH) Interventions SDOH Screenings   Food Insecurity: No Food Insecurity (10/28/2024)  Housing: Low Risk (10/28/2024)  Transportation Needs: No Transportation Needs (10/28/2024)  Utilities: Not At Risk (10/28/2024)  Social Connections: Moderately Integrated (10/28/2024)  Tobacco Use: Low Risk (10/28/2024)     Readmission Risk Interventions    10/30/2024   12:55 PM  Readmission Risk Prevention Plan  Post Dischage Appt Complete  Medication Screening Complete  Transportation Screening Complete

## 2024-11-05 ENCOUNTER — Telehealth: Payer: Self-pay

## 2024-11-05 NOTE — Telephone Encounter (Signed)
 Called back and moved patient appointment up to Friday.  Patient's nurse/ caretaker notified of the need to discuss BP with Primary care provider.

## 2024-11-05 NOTE — Telephone Encounter (Signed)
 Patient's home health nurse called to report that the patient is having new symptoms.  Her most recent BP was 185/82. She states patient has been throwing up today.  This is new from yesterday.   She was concerned that given the patients recent surgery that this could be ICP related. Her respirations are normal as is her heart rate.  The patient denies any headaches at this time. Nurse states pupils are PERRLA 3mm brisk  Unable to accurately assess if she has had change in vision due to the location of her surgery impacting the optic nerve. Patient states she can see some like she is looking through a jigsaw puzzle.

## 2024-11-08 ENCOUNTER — Ambulatory Visit: Admitting: Neurosurgery

## 2024-11-08 ENCOUNTER — Encounter: Payer: Self-pay | Admitting: Neurosurgery

## 2024-11-08 VITALS — BP 125/85 | HR 79 | Temp 97.6°F | Ht 64.0 in | Wt 209.8 lb

## 2024-11-08 DIAGNOSIS — D329 Benign neoplasm of meninges, unspecified: Secondary | ICD-10-CM

## 2024-11-08 NOTE — Progress Notes (Signed)
 74 year old lady with a tuberculum sella meningioma with significant visual compromise on the left.  Patient underwent a craniotomy for resection.  Postoperatively her left eye, ipsilateral eye, vision got better however that night, she had a decline in the right eye.  She also had a remote temporal hemorrhage which may be related to it.  Today she says that her right eye vision is gradually getting better.  The left eye is better than it was preoperatively.  We went over the questions she has and she is going to have a friend stay with her but according to the friend she is doing very well and is completely independent.  Will remove the staples today and I will see her back in 3 weeks.  She has stopped her Keppra  already.

## 2024-11-12 ENCOUNTER — Encounter: Admitting: Neurosurgery

## 2024-11-29 ENCOUNTER — Encounter: Admitting: Neurosurgery

## 2024-12-03 ENCOUNTER — Encounter: Admitting: Neurosurgery
